# Patient Record
Sex: Male | Born: 1965 | Hispanic: No | Marital: Married | Smoking: Current every day smoker
Health system: Southern US, Community
[De-identification: ages and names within clinical notes are randomized; demographics above are authoritative.]

## PROBLEM LIST (undated history)

## (undated) DIAGNOSIS — F17210 Nicotine dependence, cigarettes, uncomplicated: Secondary | ICD-10-CM

## (undated) DIAGNOSIS — T8484XA Pain due to internal orthopedic prosthetic devices, implants and grafts, initial encounter: Secondary | ICD-10-CM

## (undated) DIAGNOSIS — Z8619 Personal history of other infectious and parasitic diseases: Secondary | ICD-10-CM

## (undated) DIAGNOSIS — I10 Essential (primary) hypertension: Secondary | ICD-10-CM

## (undated) DIAGNOSIS — M25539 Pain in unspecified wrist: Secondary | ICD-10-CM

## (undated) DIAGNOSIS — E119 Type 2 diabetes mellitus without complications: Secondary | ICD-10-CM

## (undated) DIAGNOSIS — E785 Hyperlipidemia, unspecified: Secondary | ICD-10-CM

## (undated) DIAGNOSIS — Z87898 Personal history of other specified conditions: Secondary | ICD-10-CM

## (undated) HISTORY — DX: Nicotine dependence, cigarettes, uncomplicated: F17.210

## (undated) HISTORY — DX: Hyperlipidemia, unspecified: E78.5

## (undated) HISTORY — DX: Personal history of other specified conditions: Z87.898

## (undated) HISTORY — DX: Type 2 diabetes mellitus without complications: E11.9

## (undated) HISTORY — DX: Pain in unspecified wrist: M25.539

## (undated) HISTORY — DX: Personal history of other infectious and parasitic diseases: Z86.19

---
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
## 2005-04-16 ENCOUNTER — Ambulatory Visit: Payer: Self-pay | Admitting: Pulmonary Disease

## 2005-11-19 ENCOUNTER — Ambulatory Visit: Payer: Self-pay | Admitting: Pulmonary Disease

## 2006-05-20 ENCOUNTER — Ambulatory Visit: Payer: Self-pay | Admitting: Pulmonary Disease

## 2007-05-18 ENCOUNTER — Encounter: Payer: Self-pay | Admitting: Pulmonary Disease

## 2007-06-20 ENCOUNTER — Telehealth (INDEPENDENT_AMBULATORY_CARE_PROVIDER_SITE_OTHER): Payer: Self-pay | Admitting: *Deleted

## 2007-07-13 DIAGNOSIS — E785 Hyperlipidemia, unspecified: Secondary | ICD-10-CM | POA: Insufficient documentation

## 2007-07-14 ENCOUNTER — Ambulatory Visit: Payer: Self-pay | Admitting: Pulmonary Disease

## 2007-07-14 DIAGNOSIS — F17200 Nicotine dependence, unspecified, uncomplicated: Secondary | ICD-10-CM | POA: Insufficient documentation

## 2007-07-14 DIAGNOSIS — J309 Allergic rhinitis, unspecified: Secondary | ICD-10-CM | POA: Insufficient documentation

## 2007-07-14 DIAGNOSIS — M25539 Pain in unspecified wrist: Secondary | ICD-10-CM | POA: Insufficient documentation

## 2007-07-14 DIAGNOSIS — R51 Headache: Secondary | ICD-10-CM | POA: Insufficient documentation

## 2007-07-14 DIAGNOSIS — R519 Headache, unspecified: Secondary | ICD-10-CM | POA: Insufficient documentation

## 2007-07-14 DIAGNOSIS — A630 Anogenital (venereal) warts: Secondary | ICD-10-CM

## 2007-07-14 LAB — CONVERTED CEMR LAB
ALT: 55 units/L — ABNORMAL HIGH (ref 0–53)
AST: 28 units/L (ref 0–37)
Bilirubin, Direct: 0.3 mg/dL (ref 0.0–0.3)
CO2: 26 meq/L (ref 19–32)
Calcium: 9.7 mg/dL (ref 8.4–10.5)
Chloride: 105 meq/L (ref 96–112)
Cholesterol: 209 mg/dL (ref 0–200)
Direct LDL: 131.2 mg/dL
GFR calc non Af Amer: 88 mL/min
Glucose, Bld: 98 mg/dL (ref 70–99)
Sodium: 141 meq/L (ref 135–145)
Total Bilirubin: 1.7 mg/dL — ABNORMAL HIGH (ref 0.3–1.2)
Total CHOL/HDL Ratio: 4.6
Total Protein: 7 g/dL (ref 6.0–8.3)

## 2008-05-07 ENCOUNTER — Telehealth (INDEPENDENT_AMBULATORY_CARE_PROVIDER_SITE_OTHER): Payer: Self-pay | Admitting: *Deleted

## 2008-05-08 ENCOUNTER — Encounter: Payer: Self-pay | Admitting: Adult Health

## 2008-05-08 ENCOUNTER — Ambulatory Visit: Payer: Self-pay | Admitting: Pulmonary Disease

## 2008-05-17 ENCOUNTER — Telehealth (INDEPENDENT_AMBULATORY_CARE_PROVIDER_SITE_OTHER): Payer: Self-pay | Admitting: *Deleted

## 2008-06-10 ENCOUNTER — Telehealth (INDEPENDENT_AMBULATORY_CARE_PROVIDER_SITE_OTHER): Payer: Self-pay | Admitting: *Deleted

## 2008-06-13 ENCOUNTER — Ambulatory Visit: Payer: Self-pay | Admitting: Pulmonary Disease

## 2008-06-13 ENCOUNTER — Telehealth: Payer: Self-pay | Admitting: Internal Medicine

## 2008-06-18 ENCOUNTER — Ambulatory Visit: Payer: Self-pay | Admitting: Pulmonary Disease

## 2008-06-18 DIAGNOSIS — E118 Type 2 diabetes mellitus with unspecified complications: Secondary | ICD-10-CM

## 2008-06-18 LAB — CONVERTED CEMR LAB
Direct LDL: 105.5 mg/dL
Total CHOL/HDL Ratio: 6
Triglycerides: 726 mg/dL (ref 0–149)

## 2008-06-27 LAB — CONVERTED CEMR LAB
ALT: 30 units/L (ref 0–53)
AST: 24 units/L (ref 0–37)
Basophils Relative: 0.5 % (ref 0.0–3.0)
Bilirubin, Direct: 0.2 mg/dL (ref 0.0–0.3)
CO2: 22 meq/L (ref 19–32)
Chloride: 93 meq/L — ABNORMAL LOW (ref 96–112)
Eosinophils Relative: 1.3 % (ref 0.0–5.0)
Glucose, Bld: 386 mg/dL — ABNORMAL HIGH (ref 70–99)
HCT: 48.4 % (ref 39.0–52.0)
Hemoglobin: 17.1 g/dL — ABNORMAL HIGH (ref 13.0–17.0)
Lymphocytes Relative: 25.7 % (ref 12.0–46.0)
Monocytes Absolute: 0.3 10*3/uL (ref 0.1–1.0)
Monocytes Relative: 4 % (ref 3.0–12.0)
Mucus, UA: NEGATIVE
Neutro Abs: 5.4 10*3/uL (ref 1.4–7.7)
Nitrite: NEGATIVE
RBC: 5.04 M/uL (ref 4.22–5.81)
RDW: 11.6 % (ref 11.5–14.6)
Sed Rate: 5 mm/hr (ref 0–16)
Sodium: 132 meq/L — ABNORMAL LOW (ref 135–145)
Squamous Epithelial / LPF: NEGATIVE /lpf
TSH: 1.53 microintl units/mL (ref 0.35–5.50)
Total Bilirubin: 2.3 mg/dL — ABNORMAL HIGH (ref 0.3–1.2)
Total Protein, Urine: NEGATIVE mg/dL
Total Protein: 6.6 g/dL (ref 6.0–8.3)
Urine Glucose: >=1000 mg/dL — CR
WBC: 7.8 10*3/uL (ref 4.5–10.5)
pH: 5.5 (ref 5.0–8.0)

## 2008-07-16 ENCOUNTER — Ambulatory Visit: Payer: Self-pay | Admitting: Pulmonary Disease

## 2008-07-21 LAB — CONVERTED CEMR LAB
BUN: 8 mg/dL (ref 6–23)
Calcium: 9.7 mg/dL (ref 8.4–10.5)
Cholesterol: 192 mg/dL (ref 0–200)
Creatinine, Ser: 0.8 mg/dL (ref 0.4–1.5)
GFR calc Af Amer: 138 mL/min
Glucose, Bld: 107 mg/dL — ABNORMAL HIGH (ref 70–99)
HDL: 47.2 mg/dL (ref >39.0)
Triglycerides: 204 mg/dL (ref 0–149)

## 2008-07-23 ENCOUNTER — Telehealth (INDEPENDENT_AMBULATORY_CARE_PROVIDER_SITE_OTHER): Payer: Self-pay | Admitting: *Deleted

## 2008-10-15 ENCOUNTER — Telehealth: Payer: Self-pay | Admitting: Pulmonary Disease

## 2008-10-18 ENCOUNTER — Ambulatory Visit: Payer: Self-pay | Admitting: Pulmonary Disease

## 2008-10-18 LAB — CONVERTED CEMR LAB
AST: 22 units/L (ref 0–37)
BUN: 20 mg/dL (ref 6–23)
Cholesterol: 192 mg/dL (ref 0–200)
GFR calc non Af Amer: 87.58 mL/min (ref >60)
Hgb A1c MFr Bld: 5.5 % (ref 4.6–6.5)
LDL Cholesterol: 128 mg/dL — ABNORMAL HIGH (ref 0–99)
Potassium: 4.6 meq/L (ref 3.5–5.1)
Sodium: 142 meq/L (ref 135–145)
Total Bilirubin: 1.6 mg/dL — ABNORMAL HIGH (ref 0.3–1.2)
Total CHOL/HDL Ratio: 4
VLDL: 16.6 mg/dL (ref 0.0–40.0)

## 2008-12-02 ENCOUNTER — Telehealth (INDEPENDENT_AMBULATORY_CARE_PROVIDER_SITE_OTHER): Payer: Self-pay | Admitting: *Deleted

## 2009-01-28 ENCOUNTER — Telehealth: Payer: Self-pay | Admitting: Pulmonary Disease

## 2009-02-07 ENCOUNTER — Ambulatory Visit: Payer: Self-pay | Admitting: Pulmonary Disease

## 2009-02-11 ENCOUNTER — Ambulatory Visit: Payer: Self-pay | Admitting: Pulmonary Disease

## 2009-02-11 LAB — CONVERTED CEMR LAB
CO2: 31 meq/L (ref 19–32)
Chloride: 109 meq/L (ref 96–112)
Creatinine, Ser: 1.1 mg/dL (ref 0.4–1.5)

## 2009-07-22 ENCOUNTER — Telehealth (INDEPENDENT_AMBULATORY_CARE_PROVIDER_SITE_OTHER): Payer: Self-pay | Admitting: *Deleted

## 2009-07-25 ENCOUNTER — Ambulatory Visit: Payer: Self-pay | Admitting: Pulmonary Disease

## 2009-08-01 ENCOUNTER — Ambulatory Visit: Payer: Self-pay | Admitting: Pulmonary Disease

## 2009-08-01 LAB — CONVERTED CEMR LAB
Basophils Relative: 0.1 % (ref 0.0–3.0)
CO2: 28 meq/L (ref 19–32)
Eosinophils Relative: 4.4 % (ref 0.0–5.0)
Glucose, Bld: 114 mg/dL — ABNORMAL HIGH (ref 70–99)
HCT: 50.5 % (ref 39.0–52.0)
HDL: 48.7 mg/dL (ref >39.00)
Hgb A1c MFr Bld: 6.3 % (ref 4.6–6.5)
LDL Cholesterol: 122 mg/dL — ABNORMAL HIGH (ref 0–99)
Lymphs Abs: 2.5 10*3/uL (ref 0.7–4.0)
MCV: 97 fL (ref 78.0–100.0)
Monocytes Absolute: 0.4 10*3/uL (ref 0.1–1.0)
Potassium: 4.6 meq/L (ref 3.5–5.1)
RBC: 5.21 M/uL (ref 4.22–5.81)
Sodium: 141 meq/L (ref 135–145)
Total Bilirubin: 1 mg/dL (ref 0.3–1.2)
Total CHOL/HDL Ratio: 4
VLDL: 15 mg/dL (ref 0.0–40.0)
WBC: 6.3 10*3/uL (ref 4.5–10.5)

## 2010-01-13 ENCOUNTER — Telehealth: Payer: Self-pay | Admitting: Pulmonary Disease

## 2010-01-23 ENCOUNTER — Ambulatory Visit: Payer: Self-pay | Admitting: Pulmonary Disease

## 2010-01-30 ENCOUNTER — Ambulatory Visit: Payer: Self-pay | Admitting: Pulmonary Disease

## 2010-01-30 LAB — CONVERTED CEMR LAB
BUN: 18 mg/dL (ref 6–23)
Bilirubin, Direct: 0.2 mg/dL (ref 0.0–0.3)
Chloride: 105 meq/L (ref 96–112)
HDL: 42.6 mg/dL (ref >39.00)
Hgb A1c MFr Bld: 6.2 % (ref 4.6–6.5)
Potassium: 5.1 meq/L (ref 3.5–5.1)
Sodium: 142 meq/L (ref 135–145)
Total Bilirubin: 1.5 mg/dL — ABNORMAL HIGH (ref 0.3–1.2)
Total Protein: 6.6 g/dL (ref 6.0–8.3)
Triglycerides: 111 mg/dL (ref 0.0–149.0)
VLDL: 22.2 mg/dL (ref 0.0–40.0)

## 2010-02-10 ENCOUNTER — Telehealth: Payer: Self-pay | Admitting: Pulmonary Disease

## 2010-04-06 ENCOUNTER — Telehealth (INDEPENDENT_AMBULATORY_CARE_PROVIDER_SITE_OTHER): Payer: Self-pay | Admitting: *Deleted

## 2010-07-16 NOTE — Progress Notes (Signed)
Summary: sick  Phone Note Call from Patient Call back at Home Phone [phone]   Caller: Spouse-Jerome Waller Call For: Jerome Waller Reason for Call: Talk to Nurse Summary of Call: pt nose congested feels head is full of gunk, cough, tight,low grade fever, feels crappy.  Please advise [facility] Initial call taken by: Eugene Gavia,  February 10, 2010 4:19 PM  Follow-up for Phone Call        called and spoke with pt's wife.  wife states pt's symptoms started on Saturday.  Pt c/o head and nasal congestion, headache, coughing up brown tinged sputum, no appetitie  and slight facial pressure.  Denies sore throat and body aches.  Pt has tried robitussin and nasal saline spray with no relief of symptoms.  Please advise.  thanks.  Aundra Millet Reynolds LPN  February 10, 2010 4:45 PM  NKDA  Additional Follow-up for Phone Call Additional follow up Details #1::        per SN---ok for pt to have augmentin 875mg   1 by mouth two times a day until gone  #20  and use the mucinex 2 by mouth two times a day with plenty of fluids.  called and spoke with pts wife and she is aware.  pt to cont to use the robitussion for cough. Randell Loop CMA  February 10, 2010 4:55 PM     New/Updated Medications: AUGMENTIN 875-125 MG TABS (AMOXICILLIN-POT CLAVULANATE) take one tablet by mouth two times a day until gone MUCINEX 600 MG XR12H-TAB (GUAIFENESIN) take 2 by mouth two times a day with plenty of fluids Prescriptions: AUGMENTIN 875-125 MG TABS (AMOXICILLIN-POT CLAVULANATE) take one tablet by mouth two times a day until gone  #20 x 0   Entered by:   Randell Loop CMA   Authorized by:   Michele Mcalpine MD   Signed by:   Randell Loop CMA on 02/10/2010   Method used:   Electronically to        [facility]* (retail)       [address] [place], [place]  [address]       Ph: [phone] or [phone]       Fax: [phone]   RxID:   [id number]

## 2010-07-16 NOTE — Progress Notes (Signed)
Summary: head cold  Phone Note Call from Patient Call back at Home Phone [phone]   Caller: wife- karen Call For: nadel Summary of Call: pt has come down with a head cold no fever [facility] Initial call taken by: Lacinda Axon,  April 06, 2010 5:25 PM  Follow-up for Phone Call        Spoke with pt's spouse.  Pt c/o nasal congestion and occ dry cough x several months- states that he got worse after the flu shot.  We called in augmetin for him on 8/30 and he states that this helped but now symptoms are coming back.  Pls advise thanks NKDA Follow-up by: Vernie Murders,  April 06, 2010 5:32 PM  Additional Follow-up for Phone Call Additional follow up Details #1::        per SN---ok for pt to have zpak #1  TAD with no refills, use the mucinex d  2 two times a day with plenty of fluids and stay on this  and must quit all smoking and 2nd hand exposure.  thanks Randell Loop CMA  April 07, 2010 9:07 AM     Additional Follow-up for Phone Call Additional follow up Details #2::    Spoke with pt's spouse and notified of the above recs per SN.  Spouse verbalized understanding and will let the pt know. Follow-up by: Vernie Murders,  April 07, 2010 9:15 AM  New/Updated Medications: ZITHROMAX Z-PAK 250 MG TABS (AZITHROMYCIN) take as directed Prescriptions: ZITHROMAX Z-PAK 250 MG TABS (AZITHROMYCIN) take as directed  #1 x 0   Entered by:   Vernie Murders   Authorized by:   Michele Mcalpine MD   Signed by:   Vernie Murders on 04/07/2010   Method used:   Electronically to        [facility]* (retail)       [address] [place], [place]  [address]       Ph: [phone] or [phone]       Fax: [phone]   RxID:   [id number]

## 2010-07-16 NOTE — Progress Notes (Signed)
Summary: set up labs  Phone Note Call from Patient Call back at Home Phone [phone]   Caller: Mom Call For: nadel Summary of Call: pt's mom karen requests to have pt's labs set up for nex fri 8/12 (when he's off).  Initial call taken by: Tivis Ringer, CNA,  January 13, 2010 9:20 AM  Follow-up for Phone Call        pt has appt 8/19, wants to come for labs next friday. pls have sn mark labs and put in system for pt let pt know once labs are in   Tammy Landrum CMA  January 13, 2010 10:17 AM   Additional Follow-up for Phone Call Additional follow up Details #1::        per SN---ok for pt to come in on 8-12 for fasting labs prior to his appt on 8-19.  called and spoke with pts wife and she is aware of this. Randell Loop CMA  January 13, 2010 2:47 PM

## 2010-07-16 NOTE — Assessment & Plan Note (Signed)
Summary: 10m reck/klw   CC:  6 month ROV & review of mult medical problems....  History of Present Illness: 45 y/o WM here for a follow up visit... he was diagnosed w/ DM in [name] & doing well on diet/ exercise/ meds since then...   ~   [name]:  feeling bad ever since Thanksgiving when he "got the crud"-  c/o fatigue, sore throat, sl cough w/ beige phlegm, abd discomfort w/o n/v or change in bowel habits... he denies f/c/s but had decreased appetite, increased thirst, dry mouth and polyuria... he has noted a 25# weight loss... his mother-in-law is diabetic and checked his BS which was HHH- he put himself on a diet and rechecked 294... he waited til after the holidays to call us and let us know... BS= 386, A1c= 14.6.Marland Kitchen. started on Metformin 500Bid + diet etc...  ~  Feb10:  he states much improved over the last month- he called [place] after 2 weeks and Metformin incr to 1000mg Bid & Glimepiride 4mg /d added... FBS down to 127-159 range and 2H pc = 99-192... weight stable at 183#... feeling better overall...  ~  May10:  doing well for the last several months... weight stabilize in the 180-185 range, and BS's range 80 to 140 but he notes ?hypoglycemic at 9-10AM at work (can't check BS but symptoms compatible & feels better after eating... labs today w/ BS= 109 & A1c= 5.5, so we will decr the Gliep4 to 1/2 Qam...  ~  Aug10:  continues to do well w/ diet/ exercise/ meds- BS at home 90-120 w/ 100 ave and labs here 8/27 showed FBS= 100, A1c= 5.4.Marland KitchenMarland Kitchen we discussed stopping the Glimepiride and continuing the Metformin 1000mg  Bid...   ~  August 01, 2009:  he has stayed on diet + exercise and weaned off the Metformin over the last 23mo (stopped 10/10)- now on diet alone... BS remains 100-120 & feeling well>> labs show FBS= 114, A1c=6.3 & he is encouraged to continue diet + exercise... he remains on Simva80 & recent FLP fair w/ LDL 122 (we discussed poss change to generic Lipitor later this yr)... he still smokes  ~1/4 ppd &  encouraged to quit...   ~  January 30, 2010:  he's had a good 23mo w/o new complaints or concerns... BS at home all 100-140 range, and his A1c is 6.2 on diet alone... feeling well w/o CP, palpit/ SOB, etc... he needs to quit all smoking & incr exercise program, add ASA 81mg /d... we decided to switch his Simva80 to Lipitor40 at this time...   Current Problems:   ALLERGIC RHINITIS (ICD-477.9) - mild symptoms... controlled w/ OTC Claritin or Zyrtek...  CIGARETTE SMOKER (ICD-305.1) - still smokes 1/4 ppd despite all efforts to get him to quit... he tried and failed Chantix in the past...   ~  1/10:  he tells me that he quit smoking 2 days ago, did it on his own!!!  ~  5/10:  he tells me now that he's smoking again, < 1/2 ppd... encouraged to quit!!!  ~  8/10:  back to 1/2 ppd and he feels this may be the best he can do.  ~  2/11:  down to 1/4 ppd now & encouraged to quit!  ~  8/11:  no change, doesn't want Chantix etc...  HYPERLIPIDEMIA (ICD-272.4) - hx Chol >300 in the past... doing fair on diet + SIMVASTATIN 80mg > change to LIP40 now...  ~  FLP 12/07 on Lip40 showed TChol 176, TG 101, HDL 44, LDL  111  ~  FLP 1/09 on Lip40 showed TChol 209, TG 148, HDL 46, LDL 131... rec- change to Simva80.  ~  FLP 1/10 on Simva80 showed TChol 319, TG 726, HDL 53, LDL 106... rec- new Rx for DM.  ~  FLP 5/10 on Simva80 showed TChol 192, TG 83, HDL 48, LDL 128... same med, better diet.  ~  FLP 2/11 on Simva80 showed TChol 186, TG 75, HDL 49, LDL 122  ~  FLP 8/11 on Simva80 showed TChol 208, TG 111, HDL 43, LDL 144... rec ch to Lipitor40.  DIABETES MELLITUS (ICD-250.00) - new Dx [name]> started on Metformin & Glimepiride;  he did very well w/ diet + exercise & weaned off all meds 10/10- now on diet alone...  ~  labs 1/10 showed BS= 386, HgA1c= 14.6.Marland Kitchen. rec> started Metformin1000Bid + diet/ exerc.  ~  labs 2/10 showed BS= 107, HgA1c= 10.7.Marland Kitchen. rec> add Glimep4mg /d.  ~  labs 5/10 showed BS= 107, HgA1c= 5.5.Marland Kitchen. rec> decr  Glimep4 to 1/2 tabQam.  ~  labs 8/10 showed BS= 100, A1c= 5.4.Marland Kitchen. rec> stop the Glimep, & taper Metform.  ~  labs 2/11 showed BS= 114, A1c= 6.3.Marland KitchenMarland Kitchen continue diet + exercise.  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  WRIST PAIN (ICD-719.43) - eval by DrKuzma w/ MRI showing scapholunate ligament disruption... Rx splint, Mobic, consideration of surgery.  Hx of HEADACHE (ICD-784.0)   Preventive Screening-Counseling & Management  Alcohol-Tobacco     Smoking Status: quit     Packs/Day: <1/2     Year Quit: 06/2008  Allergies (verified): No Known Drug Allergies  Comments:  Nurse/Medical Assistant: The patient's medications and allergies were reviewed with the patient and were updated in the Medication and Allergy Lists.  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 Father alive at age 24 w/ high cholesterol Mother alive at age 44 in good health 2 Siblings: 1 Brother w/ high cholesterol 1 Sister w/ high cholesterol, & incr BS on diet alone. ** there is a hx of DM in paternal grandfather **  Social History: Reviewed history from 06/18/2008 and no changes required. Married - wife Clydie Braun x 14 yrs no children smoker - states he quit 06/16/08 social Etoh Smoking Status:  quit  Review of Systems      See HPI  The patient denies anorexia, fever, weight loss, weight gain, vision loss, decreased hearing, hoarseness, chest pain, syncope, dyspnea on exertion, peripheral edema, prolonged cough, headaches, hemoptysis,  abdominal pain, melena, hematochezia, severe indigestion/heartburn, hematuria, incontinence, muscle weakness, suspicious skin lesions, transient blindness, difficulty walking, depression, unusual weight change, abnormal bleeding, enlarged lymph nodes, and angioedema.    Vital Signs:  Patient profile:   45 year old male Height:      72 inches Weight:      187 pounds BMI:     25.45 O2 Sat:      98 % on Room air Temp:     98.4 degrees F oral Pulse rate:   78 / minute BP sitting:   120 / 80  (left arm) Cuff size:   regular  Vitals  Entered By: Randell Loop CMA (January 30, 2010 9:59 AM)  O2 Sat at Rest %:  98 O2 Flow:  Room air CC: 6 month ROV & review of mult medical problems... Is Patient Diabetic? Yes Pain Assessment Patient in pain? no      Comments no changes in meds today   Physical Exam  Additional Exam:  WD, WN, 45 y/o WM in NAD... GENERAL:  Alert & oriented; pleasant & cooperative... HEENT:  [place]/AT, EOM-wnl, PERRLA, EACs-clear, TMs-wnl, NOSE-clear, THROAT-clear & wnl. NECK:  Supple w/ full ROM; no JVD; normal carotid impulses w/o bruits; no thyromegaly or nodules palpated; no lymphadenopathy. CHEST:  Clear to P & A; without wheezes/ rales/ or rhonchi. HEART:  Regular Rhythm; without murmurs/ rubs/ or gallops. ABDOMEN:  Soft & nontender; normal bowel sounds; no organomegaly or masses detected. EXT: without deformities, mild arthritic change in R wrist; no varicose veins/ venous insuffic/ or edema. NEURO:  CN's intact, no focal neuro deficits... SKIN:  WNL, no lesions seen...    MISC. Report  Procedure date:  01/23/2010  Findings:      Lipid Panel (LIPID)   Cholesterol          [H]  208 mg/dL                   0-454   Triglycerides             111.0 mg/dL                 0.9-811.9   HDL                       14.78 mg/dL                 >29.56 Cholesterol LDL - Direct                             144.1 mg/dL  Hepatic/Liver Function Panel (HEPATIC)   Total  Bilirubin      [H]  1.5 mg/dL                   2.1-3.0   Direct Bilirubin          0.2 mg/dL                   8.6-5.7   Alkaline Phosphatase      94 U/L                      39-117   AST                       21 U/L                      0-37   ALT                       29 U/L                      0-53   Total Protein             6.6 g/dL                    8.4-6.9   Albumin  4.2 g/dL                    1.6-1.0  BMP (METABOL)   Sodium                    142 mEq/L                   135-145   Potassium                 5.1 mEq/L                   3.5-5.1   Chloride                  105 mEq/L                   96-112   Carbon Dioxide            28 mEq/L                    19-32   Glucose              [H]  100 mg/dL                   96-04   BUN                       18 mg/dL                    5-40   Creatinine                0.9 mg/dL                   9.8-1.1   Calcium                   9.6 mg/dL                   9.1-47.8   GFR                       98.29 mL/min                >60  Hemoglobin A1C (A1C)   Hemoglobin A1C            6.2 %     Impression & Recommendations:  Problem # 1:  DIABETES MELLITUS (ICD-250.00) He continues to do extremely well on diet + exercise... A1c= 6.2, keep up the good work. His updated medication list for this problem includes:    Aspirin 81 Mg Tabs (Aspirin) .Marland Kitchen... Take 1 tab by mouth once daily...  Problem # 2:  HYPERLIPIDEMIA (ICD-272.4) FLP not at goal despite Simva80 & in light of the new guidlines we will rec switch to LIPITOR 40mg /d... His updated medication list for this problem includes:    Lipitor 40 Mg Tabs (Atorvastatin calcium) .Marland Kitchen... Take 1 tab by mouth once daily...  Problem # 3:  CIGARETTE SMOKER (ICD-305.1) Must quit all smoking!!! Discussed smoking cessation options and strategies but he declines...  Problem # 4:  OTHER MEDICAL PROBLEMS AS NOTED>>>  Complete Medication List: 1)  Aspirin 81 Mg Tabs (Aspirin) ....  Take 1 tab by mouth once daily.Marland KitchenMarland Kitchen 2)  Lipitor 40 Mg Tabs (Atorvastatin calcium) .... Take 1 tab by mouth once daily.Marland KitchenMarland Kitchen 3)  IT consultant Strp (Glucose blood) .Marland KitchenMarland KitchenMarland Kitchen  Use as directed once a day  Patient Instructions: 1)  Today we updated your med list- see below.... 2)  We decided to change your Simvastatin to LIPITOR 40mg /d... Be sure to continue your diet - no sugars/ sweets, low carb, & stay away from cholesterol & fat... 3)  Call for any problems.Marland KitchenMarland Kitchen 4)  Please schedule a follow-up appointment in 6 months. Prescriptions: LIPITOR 40 MG TABS (ATORVASTATIN CALCIUM) take 1 tab by mouth once daily...  #30 x prn   Entered and Authorized by:   Michele Mcalpine MD   Signed by:   Michele Mcalpine MD on 01/30/2010   Method used:   Print then Give to Patient   RxID:   [id number]    Immunization History:  Influenza Immunization History:    Influenza:  historical (08/18/2009)

## 2010-07-16 NOTE — Progress Notes (Signed)
Summary: order   [id number]  Phone Note Call from Patient   Caller: Patient Call For: nadel Summary of Call: need order put in to get cholesterol checked  Initial call taken by: Rickard Patience,  July 22, 2009 9:30 AM  Follow-up for Phone Call        LM with family member for pt to call me back.  Aundra Millet Reynolds LPN  July 22, 2009 9:37 AM    pt wants to come in 2-11 for fasting labs---is this ok??  please advise. Randell Loop [facility]  July 22, 2009 9:53 AM   Additional Follow-up for Phone Call Additional follow up Details #1::        ok for pt to come in for labs and pts wife is aware that labs are in computer for pt----per SN---lip-272.0/bmp-401.9/hepat-790.5/cbcd-285.9/tsh-244.9 and a1c-250.00--- Randell Loop CMA  July 22, 2009 2:42 PM

## 2010-07-16 NOTE — Assessment & Plan Note (Signed)
Summary: 6  month follow up/lw   CC:  6 month ROV & review....  History of Present Illness: 45 y/o WM here for a follow up visit... he was diagnosed w/ DM in [name] & doing well on diet/ exercise/ meds since then...   ~   [name]:  feeling bad ever since Thanksgiving when he "got the crud"-  c/o fatigue, sore throat, sl cough w/ beige phlegm, abd discomfort w/o n/v or change in bowel habits... he denies f/c/s but had decreased appetite, increased thirst, dry mouth and polyuria... he has noted a 25# weight loss... his mother-in-law is diabetic and checked his BS which was HHH- he put himself on a diet and rechecked 294... he waited til after the holidays to call us and let us know... BS= 386, A1c= 14.6.Marland Kitchen. started on Metformin 500Bid + diet etc...  ~  Feb10:  he states much improved over the last month- he called [place] after 2 weeks and Metformin incr to 1000mg Bid & GLimepiride 4mg /d added... FBS down to 127-159 range and 2H pc = 99-192... weight stable at 183#... feeling better overall...  ~  May10:  doing well for the last several months... weight stabilize in the 180-185 range, and BS's range 80 to 140 but he notes ?hypoglycemic at 9-10AM at work (can't check BS but symptoms compatible & feels better after eating... labs today w/ BS= 109 & A1c= 5.5, so we will decr the Gliep4 to 1/2 Qam...  ~  Aug10:  continues to do well w/ diet/ exercise/ meds- BS at home 90-120 w/ 100 ave and labs here 8/27 showed FBS= 100, A1c= 5.4.Marland KitchenMarland Kitchen we discussed stopping the Glimepiride and continuing the Metformin 1000mg  Bid...   ~  August 01, 2009:  he has stayed on diet + exercise and weaned off the Metformin over the last 15mo (stopped 10/10)- now on diet alone... BS remains 100-120 & feeling well>> labs show FBS= 114, A1c=6.3 & he is encouraged to continue diet + exercise... he remains on Simva80 & recent FLP fair w/ LDL 122 (we discussed poss change to generic Lipitor later this yr)... he still smokes  ~1/4 ppd & encouraged to  quit...   Current Problems:   ALLERGIC RHINITIS (ICD-477.9) - mild symptoms... controlled w/ OTC Claritin or Zyrtek...  CIGARETTE SMOKER (ICD-305.1) - still smokes 1/4 ppd despite all efforts to get him to quit... he tried and failed Chantix in the past...   ~  1/10:  he tells me that he quit smoking 2 days ago, did it on his own!!!  ~  5/10:  he tells me now that he's smoking again, < 1/2 ppd... encouraged to quit!!!  ~  8/10:  back to 1/2 ppd and he feels this may be the best he can do.  ~  2/11:  down to 1/4 ppd now & encouraged to quit!  HYPERLIPIDEMIA (ICD-272.4) - hx Chol >300 in the past... doing fairl on diet + SIMVASTATIN 80mg ...  ~  FLP 12/07 on Lip40 showed TChol 176, TG 101, HDL 44, LDL 111  ~  FLP 1/09 on Lip40 showed TChol 209, TG 148, HDL 46, LDL 131... rec- change to Simva80.  ~  FLP 1/10 on Simva80 showed TChol 319, TG 726, HDL 53, LDL 106... rec- new Rx for DM.  ~  FLP 5/10 on Simva80 showed TChol 192, TG 83, HDL 48, LDL 128... same med, better diet.  ~  FLP 2/11 on Simva80 showed TChol 186, TG 75, HDL 49, LDL 122  DIABETES MELLITUS (ICD-250.00) - new Dx [name]> started on Metformin & Glimepiride;  he did very well w/ diet + exercise & weaned off all meds 10/10- now on diet alone...  ~  labs 1/10 showed BS= 386, HgA1c= 14.6.Marland Kitchen. rec> started Metformin1000Bid + diet/ exerc.  ~  labs 2/10 showed BS= 107, HgA1c= 10.7.Marland Kitchen. rec> add Glimep4mg /d.  ~  labs 5/10 showed BS= 107, HgA1c= 5.5.Marland Kitchen. rec> decr Glimep4 to 1/2 tabQam.  ~  labs 8/10 showed BS= 100, A1c= 5.4.Marland Kitchen. rec> stop the Glimep, & taper Metform.  ~  labs 2/11 showed BS= 114, A1c= 6.3.Marland KitchenMarland Kitchen continue diet + exercise.  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  WRIST PAIN (ICD-719.43) - eval by DrKuzma w/ MRI showing scapholunate ligament disruption... Rx splint, Mobic, consideration of surgery.  Hx of HEADACHE (ICD-784.0)   Allergies  (verified): No Known Drug Allergies  Comments:  Nurse/Medical Assistant: The patient's medications and allergies were reviewed with the patient and were updated in the Medication and Allergy Lists.  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 Father alive at age 43 w/ high cholesterol Mother alive at age 53 in good health 2 Siblings: 1 Brother w/ high cholesterol 1 Sister w/ high cholesterol, & incr BS on diet alone. ** there is a hx of DM in paternal grandfather **  Social History: Reviewed history from 06/18/2008 and no changes required. Married - wife Clydie Braun x 14 yrs no children smoker - states he quit 06/16/08 social Etoh  Review of Systems      See HPI  The patient denies anorexia, fever, weight loss, weight gain, vision loss, decreased hearing, hoarseness, chest pain, syncope, dyspnea on exertion, peripheral edema, prolonged cough, headaches, hemoptysis, abdominal pain, melena, hematochezia, severe indigestion/heartburn, hematuria, incontinence, muscle weakness, suspicious skin lesions, transient blindness, difficulty walking, depression, unusual weight change, abnormal bleeding, enlarged lymph nodes, and angioedema.    Vital Signs:  Patient profile:   45 year old male Height:      72 inches Weight:      185.25 pounds BMI:     25.22 O2 Sat:      97 % on Room air Temp:     97.9 degrees F oral Pulse rate:   77 / minute BP sitting:   116 / 82  (left arm) Cuff size:   regular  Vitals Entered By: Randell Loop CMA (August 01, 2009 11:51 AM)  O2 Sat at Rest %:  97 O2 Flow:  Room air CC: 6 month ROV & review... Is Patient  Diabetic? Yes Pain Assessment Patient in pain? no      Comments meds updated today   Physical Exam  Additional Exam:  WD, WN, 45 y/o WM in NAD... GENERAL:  Alert & oriented; pleasant & cooperative... HEENT:  /AT, EOM-wnl, PERRLA, EACs-clear, TMs-wnl, NOSE-clear, THROAT-clear & wnl. NECK:  Supple w/ full ROM; no JVD; normal carotid impulses w/o bruits; no thyromegaly or nodules palpated; no lymphadenopathy. CHEST:  Clear to P & A; without wheezes/ rales/ or rhonchi. HEART:  Regular Rhythm; without murmurs/ rubs/ or gallops. ABDOMEN:  Soft & nontender; normal bowel sounds; no organomegaly or masses detected. EXT: without deformities,  mild arthritic change in R wrist; no varicose veins/ venous insuffic/ or edema. NEURO:  CN's intact, no focal neuro deficits... SKIN:  WNL, no lesions seen...    MISC. Report  Procedure date:  07/25/2009  Findings:      BMP (METABOL)   Sodium                    141 mEq/L                   135-145   Potassium                 4.6 mEq/L                   3.5-5.1   Chloride                  108 mEq/L                   96-112   Carbon Dioxide            28 mEq/L                    19-32   Glucose              [H]  114 mg/dL                   11-91   BUN                       13 mg/dL                    4-78   Creatinine                0.9 mg/dL                   2.9-5.6   Calcium                   9.5 mg/dL                   2.1-30.8   GFR                       98.53 mL/min                >60  Lipid Panel (LIPID)   Cholesterol               186 mg/dL                   6-578   Triglycerides             75.0 mg/dL                  4.6-962.9   HDL                       52.84 mg/dL                 >13.24   LDL Cholesterol      [H]  401 mg/dL                   0-27  Hepatic/Liver Function Panel (HEPATIC)   Total Bilirubin           1.0 mg/dL  0.3-1.2   Direct Bilirubin          0.3 mg/dL                   1.6-1.0   Alkaline  Phosphatase      92 U/L                      39-117   AST                       21 U/L                      0-37   ALT                       27 U/L                      0-53   Total Protein             6.6 g/dL                    9.6-0.4   Albumin                   4.2 g/dL                    5.4-0.9  Comments:      CBC Platelet w/Diff (CBCD)   White Cell Count          6.3 K/uL                    4.5-10.5   Red Cell Count            5.21 Mil/uL                 4.22-5.81   Hemoglobin           [H]  17.5 g/dL                   81.1-91.4   Hematocrit                50.5 %                      39.0-52.0   MCV                       97.0 fl                     78.0-100.0   Platelet Count            204.0 K/uL                  150.0-400.0   Neutrophil %              48.3 %                      43.0-77.0   Lymphocyte %              40.3 %                      12.0-46.0   Monocyte %                6.9 %  3.0-12.0   Eosinophils%              4.4 %                       0.0-5.0   Basophils %               0.1 %                       0.0-3.0  TSH (TSH)   FastTSH                   0.98 uIU/mL                 0.35-5.50  Hemoglobin A1C (A1C)   Hemoglobin A1C            6.3 %                       4.6-6.5   Impression & Recommendations:  Problem # 1:  CIGARETTE SMOKER (ICD-305.1) Must quit smoking completely... he isn't motivated to do so... he's down to 4-5 cig/d which is better than he ever thought... encouraged to quit 1 cig at a time...  Problem # 2:  HYPERLIPIDEMIA (ICD-272.4) Not yet at goal... offered change to Lipitor80 but he doesn't want to switch as yet... His updated medication list for this problem includes:    Simvastatin 80 Mg Tabs (Simvastatin) .Marland Kitchen... 1 tab by mouth at bedtime  Problem # 3:  DIABETES MELLITUS (ICD-250.00) He has successfully stopped his oral DM meds and maintaining control on diet + exercise... FBS=114, A1c=6.3.Marland KitchenMarland Kitchen Encouraged to keep up the  good work & monitor BS...  The following medications were removed from the medication list:    Metformin Hcl 1000 Mg Tabs (Metformin hcl) .Marland Kitchen... Take 1 tab by mouth two times a day...  Problem # 4:  OTHER MEDICAL PROBLEMS AS NOTED>>> Stable-  no change.  Complete Medication List: 1)  Simvastatin 80 Mg Tabs (Simvastatin) .Marland Kitchen.. 1 tab by mouth at bedtime 2)  Bayer Contour Test Strp (Glucose blood) .... Use as directed once a day  Patient Instructions: 1)  Today we updated your med list- see below.... 2)  We refilled your Simvastatin for 2011... 3)  Keep up the great work w diet & exercise... your blood sugar & A1c test look great without meds at this point!!! 4)  Call for any questions.Marland KitchenMarland Kitchen 5)  Let's plan another recheck in 6 months w/ repeat fasting blood work. Prescriptions: SIMVASTATIN 80 MG  TABS (SIMVASTATIN) 1 tab by mouth at bedtime  #90 x prn   Entered and Authorized by:   Michele Mcalpine MD   Signed by:   Michele Mcalpine MD on 08/01/2009   Method used:   Print then Give to Patient   RxID:   [id number]

## 2010-07-27 ENCOUNTER — Telehealth (INDEPENDENT_AMBULATORY_CARE_PROVIDER_SITE_OTHER): Payer: Self-pay | Admitting: *Deleted

## 2010-07-31 ENCOUNTER — Encounter (INDEPENDENT_AMBULATORY_CARE_PROVIDER_SITE_OTHER): Payer: Self-pay | Admitting: *Deleted

## 2010-07-31 ENCOUNTER — Ambulatory Visit (INDEPENDENT_AMBULATORY_CARE_PROVIDER_SITE_OTHER): Payer: BC Managed Care – PPO | Admitting: Pulmonary Disease

## 2010-07-31 ENCOUNTER — Other Ambulatory Visit: Payer: Self-pay

## 2010-07-31 ENCOUNTER — Other Ambulatory Visit: Payer: Self-pay | Admitting: Pulmonary Disease

## 2010-07-31 ENCOUNTER — Encounter: Payer: Self-pay | Admitting: Pulmonary Disease

## 2010-07-31 DIAGNOSIS — E785 Hyperlipidemia, unspecified: Secondary | ICD-10-CM

## 2010-07-31 DIAGNOSIS — E119 Type 2 diabetes mellitus without complications: Secondary | ICD-10-CM

## 2010-07-31 DIAGNOSIS — R748 Abnormal levels of other serum enzymes: Secondary | ICD-10-CM

## 2010-07-31 DIAGNOSIS — J309 Allergic rhinitis, unspecified: Secondary | ICD-10-CM

## 2010-07-31 DIAGNOSIS — R0989 Other specified symptoms and signs involving the circulatory and respiratory systems: Secondary | ICD-10-CM

## 2010-07-31 DIAGNOSIS — F17200 Nicotine dependence, unspecified, uncomplicated: Secondary | ICD-10-CM

## 2010-07-31 LAB — CBC WITH DIFFERENTIAL/PLATELET
Basophils Relative: 0.6 % (ref 0.0–3.0)
Eosinophils Absolute: 0.3 10*3/uL (ref 0.0–0.7)
Eosinophils Relative: 5.1 % — ABNORMAL HIGH (ref 0.0–5.0)
Hemoglobin: 18 g/dL — ABNORMAL HIGH (ref 13.0–17.0)
Lymphocytes Relative: 35.8 % (ref 12.0–46.0)
MCHC: 35.7 g/dL (ref 30.0–36.0)
Monocytes Relative: 5.6 % (ref 3.0–12.0)
Neutro Abs: 3.6 10*3/uL (ref 1.4–7.7)
Neutrophils Relative %: 52.9 % (ref 43.0–77.0)
RBC: 5.18 Mil/uL (ref 4.22–5.81)
WBC: 6.9 10*3/uL (ref 4.5–10.5)

## 2010-07-31 LAB — BASIC METABOLIC PANEL
BUN: 17 mg/dL (ref 6–23)
CO2: 28 mEq/L (ref 19–32)
Chloride: 104 mEq/L (ref 96–112)
Creatinine, Ser: 0.9 mg/dL (ref 0.4–1.5)
Glucose, Bld: 168 mg/dL — ABNORMAL HIGH (ref 70–99)
Potassium: 4.4 mEq/L (ref 3.5–5.1)

## 2010-07-31 LAB — LIPID PANEL
Cholesterol: 204 mg/dL — ABNORMAL HIGH (ref 0–200)
Total CHOL/HDL Ratio: 4
Triglycerides: 120 mg/dL (ref 0.0–149.0)

## 2010-07-31 LAB — HEPATIC FUNCTION PANEL
ALT: 28 U/L (ref 0–53)
AST: 31 U/L (ref 0–37)
Albumin: 4 g/dL (ref 3.5–5.2)
Total Bilirubin: 1.1 mg/dL (ref 0.3–1.2)
Total Protein: 6.4 g/dL (ref 6.0–8.3)

## 2010-08-05 ENCOUNTER — Telehealth: Payer: Self-pay | Admitting: Pulmonary Disease

## 2010-08-05 NOTE — Progress Notes (Signed)
Summary: labs friday morning<<<ok  Phone Note Call from Patient   Caller: spouse/karen Call For: nadel Summary of Call: Patients wife called patient has an appointment on Friday and wants to have his labs drawn Friday morning this is for hia 6 month check up. They can reached at [phone] Initial call taken by: Vedia Coffer,  July 27, 2010 9:35 AM  Follow-up for Phone Call        Pt has 6 month follow-up set on  07-31-10 and wants to come in for labs before. Please advise what labs to oder. Thanks. Carron Curie CMA  July 27, 2010 11:37 AM   Additional Follow-up for Phone Call Additional follow up Details #1::        cbc, bmet, lfts, lipid panel, A1C  needs to be fasting  Additional Follow-up by: Rubye Oaks NP,  July 27, 2010 3:59 PM    Additional Follow-up for Phone Call Additional follow up Details #2::    Pt will come for fasting labs on Fri., 07/31/2010. Left msg w/ family member.Michel Bickers [facility]  July 27, 2010 4:07 PM

## 2010-08-05 NOTE — Assessment & Plan Note (Signed)
Summary: 6 months/apc   CC:  6 month ROV & review of mult medical problems....  History of Present Illness: 45 y/o WM here for a follow up visit... he has multiple medical problems as noted below...     ~   [name]:  feeling bad ever since Thanksgiving when he "got the crud"-  c/o fatigue, sore throat, sl cough w/ beige phlegm, abd discomfort, no n/v/d... he denies f/c/s but had decreased appetite, increased thirst, dry mouth and polyuria... he has noted a 25# weight loss... his mother-in-law is diabetic and checked his BS which was HHH- he put himself on a diet and rechecked 294... he waited til after the holidays to call us... BS= 386, A1c= 14.6.Marland Kitchen. started on Metformin 500Bid + diet etc...  ~  Feb10:  he states much improved over the last month- he called [place] after 2 weeks and Metformin incr to 1000mg Bid & Glimepiride 4mg /d added... FBS down to 127-159 range and 2H pc = 99-192... weight stable at 183#... feeling better overall...  ~  May10:  doing well for the last several months... weight stabilize in the 180-185 range, and BS's range 80 to 140 but he notes ?hypoglycemic at 9-10AM at work (can't check BS but symptoms compatible & feels better after eating... labs today w/ BS= 109 & A1c= 5.5, so we will decr the Gliep4 to 1/2 Qam...  ~  Aug10:  continues to do well w/ diet/ exercise/ meds- BS at home 90-120 w/ 100 ave and labs here 8/27 showed FBS= 100, A1c= 5.4.Marland KitchenMarland Kitchen we discussed stopping the Glimepiride and continuing the Metformin 1000mg  Bid...   ~  ZOX09:  he has stayed on diet + exercise and weaned off the Metformin over the last 49mo (stopped 10/10)- now on diet alone... BS remains 100-120 & feeling well>> labs show FBS= 114, A1c=6.3 & he is encouraged to continue diet + exercise... he remains on Simva80 & recent FLP fair w/ LDL 122 (we discussed poss change to generic Lipitor later this yr)... he still smokes  ~1/4 ppd & encouraged to quit...  ~  Aug11:  he's had a good 49mo w/o new complaints or  concerns... BS at home all 100-140 range, and his A1c is 6.2 on diet alone... feeling well w/o CP, palpit/ SOB, etc... he needs to quit all smoking & incr exercise program, add ASA 81mg /d... we decided to switch his Simva80 to Lipitor40 at this time...   ~  July 31, 2010:  he states he had stopped checking his BS at home (prev all 100-120), feeling well, wt stable, still on diet (sort of)> then several weeks ago he checked his BS>300 x several days so he started taking his Metformin 1000mg  Bid again & BS improved to 150-180 range- no intercurrent infection or other obvious reason for the exac... labs ret w/ BS=168, A1c=9.2 & we discussed restarting METFORMIN 500mg  Bid along w/ strict DM diet, incr water intake etc...  still smoking & NEEDS TO QUIT... still taking the Simva80 w/ LDL 139 & NEEDS TO START LIP40.    Current Problems:   ALLERGIC RHINITIS (ICD-477.9) - mild symptoms... controlled w/ OTC Claritin or Zyrtek...  CIGARETTE SMOKER (ICD-305.1) - still smokes 1/4 ppd despite all efforts to get him to quit... he tried and failed Chantix in the past...   ~  1/10:  he tells me that he quit smoking 2 days ago, did it on his own!!!  ~  5/10:  he tells me now that he's smoking again, <  1/2 ppd... encouraged to quit!!!  ~  8/10:  back to 1/2 ppd and he feels this may be the best he can do.  ~  2/11-2/12:  down to 1/4 ppd now but can't seem to do any better.  HYPERLIPIDEMIA (ICD-272.4) - hx Chol >300 in the past... doing fair on diet + SIMVASTATIN 80mg > we rec change to LIPITOR 40mg /d but he hasn't filled the new Rx yet...  ~  FLP 12/07 on Lip40 showed TChol 176, TG 101, HDL 44, LDL 111  ~  FLP 1/09 on Lip40 showed TChol 209, TG 148, HDL 46, LDL 131... rec- change to Simva80.  ~  FLP 1/10 on Simva80 showed TChol 319, TG 726, HDL 53, LDL 106... rec- new Rx for DM.  ~  FLP 5/10 on Simva80 showed TChol 192, TG 83, HDL 48, LDL 128... same med, better diet.  ~  FLP 2/11 on Simva80 showed TChol 186,  TG 75, HDL 49, LDL 122  ~  FLP 8/11 on Simva80 showed TChol 208, TG 111, HDL 43, LDL 144... rec ch to Lipitor40 (he never did).  ~  FLP 2/12 on Simva80 showed TChol 204, TG 120, HDL 45, LDL 139... rec ch to Lip40 now.  DIABETES MELLITUS (ICD-250.00) - new Dx [name]> started on Metformin & Glimepiride;  he did very well w/ diet + exercise & weaned off all meds 10/10 & did very well until 2/12 when BS inexplicably went back up >300 & A1c=9.2- rec to restart Metformin.  ~  labs 1/10 showed BS= 386, HgA1c= 14.6.Marland Kitchen. rec> started Metformin1000Bid + diet/ exerc.  ~  labs 2/10 showed BS= 107, HgA1c= 10.7.Marland Kitchen. rec> add Glimep4mg /d.  ~  labs 5/10 showed BS= 107, HgA1c= 5.5.Marland Kitchen. rec> decr Glimep4 to 1/2 tabQam.  ~  labs 8/10 showed BS= 100, A1c= 5.4.Marland Kitchen. rec> stop the Glimep, & taper Metform.  ~  labs 2/11 showed BS= 114, A1c= 6.3.Marland KitchenMarland Kitchen continue diet + exercise.  ~  labs 8/11 showed BS= 100, A1c= 6.2  ~  labs 2/12 (back on Metform) showed BS= 168, A1c= 9.2.Marland Kitchen. rec> start Metform500Bid +diet, +water.  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  WRIST PAIN (ICD-719.43) - eval by DrKuzma w/ MRI showing scapholunate ligament disruption... Rx splint, Mobic, consideration of surgery.  Hx of HEADACHE (ICD-784.0)  Health Maintenance:  rec to take ASA 81mg /d...  ~  GI:  Age 19- no hx GI problems, hematochezia, etc...  ~  GU:  He is established w/ DrOttelin due to condyloma...  ~  Immunizations:  he is rec to get the yearly Flu vaccines...    Current Medications (verified): 1)  Aspirin 81 Mg Tabs (Aspirin) .... Take 1 Tab By Mouth Once Daily.Marland KitchenMarland Kitchen 2)  Simvastatin 80 Mg Tabs (Simvastatin) .Marland Kitchen.. 1 By Mouth Once Daily 3)  Bayer Contour Test  Strp (Glucose Blood) .... Use As Directed Once A Day 4)  Mucinex 600 Mg Xr12h-Tab (Guaifenesin) .... Take 2 By Mouth Two Times A Day With Plenty of Fluids 5)  Metformin Hcl 1000 Mg Tabs (Metformin  Hcl) .Marland Kitchen.. 1 By Mouth Two Times A Day 6)  Multivitamins  Tabs (Multiple Vitamin) .Marland Kitchen.. 1 By Mouth Once Daily 7)  Fish Oil 1200 Mg Caps (Omega-3 Fatty Acids) .Marland Kitchen.. 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 Father alive at age 49 w/ high cholesterol Mother alive at age 62 in good health 2 Siblings: 1 Brother w/ high cholesterol 1 Sister w/ high cholesterol, & incr BS on diet alone. ** there is a hx of DM in paternal grandfather **  Social History: Reviewed history from 01/30/2010 and no changes required. Married - wife Clydie Braun x 14 yrs no children smoker - states he quit 06/16/08 social Etoh  Review of Systems      See HPI  The patient denies anorexia, fever, weight loss, weight gain, vision loss, decreased hearing, hoarseness, chest pain, syncope, dyspnea on exertion, peripheral edema, prolonged cough, headaches, hemoptysis, abdominal pain, melena, hematochezia, severe indigestion/heartburn, hematuria, incontinence, muscle weakness, suspicious skin lesions, transient blindness, difficulty walking, depression, unusual weight change, abnormal bleeding, enlarged lymph nodes, and angioedema.    Vital Signs:  Patient profile:   45 year old male Height:      72 inches Weight:      186 pounds BMI:     25.32 O2 Sat:      98 % Temp:     97.8 degrees F oral Pulse rate:   76 / minute BP sitting:   140 / 70  (right arm) Cuff size:   regular  Vitals Entered By: Kandice Hams CMA (July 31, 2010 10:49 AM) CC: 6 month ROV & review of mult medical problems... Comments pt still  taking Simvastatin   Physical Exam  Additional Exam:  WD, WN, 45 y/o WM in NAD... GENERAL:  Alert & oriented; pleasant & cooperative... HEENT:  [place]/AT, EOM-wnl, PERRLA, EACs-clear, TMs-wnl, NOSE-clear, THROAT-clear & wnl. NECK:  Supple w/ full ROM; no JVD; normal carotid impulses w/o bruits; no thyromegaly or nodules palpated; no lymphadenopathy. CHEST:  Clear to P & A; without wheezes/ rales/ or rhonchi. HEART:  Regular Rhythm; without murmurs/ rubs/ or gallops. ABDOMEN:  Soft & nontender; normal bowel sounds; no organomegaly or masses detected. EXT: without deformities, mild arthritic change in R wrist; no varicose veins/ venous insuffic/ or edema. NEURO:  CN's intact, no focal neuro deficits... SKIN:  WNL, no lesions seen...    Impression & Recommendations:  Problem # 1:  DIABETES MELLITUS (ICD-250.00) Very surprizing to see DM flair back like this w/o cause (no infection, on diet, wt same, etc)... in any event we discussed low carb diet, get wt down further, & start METFORMIN 500mg Bid... monitor sugar at home & call for questions... His updated medication list for this problem includes:    Aspirin 81 Mg Tabs (Aspirin) .Marland Kitchen... Take 1 tab by mouth once daily...    Metformin Hcl 500 Mg Tabs (Metformin hcl) .Marland Kitchen... Take 1 tab by mouth two times a day  Problem # 2:  HYPERLIPIDEMIA (ICD-272.4) His LDL is still  ~140 but he is still on Simva80 (must of had one huge stockpile!!!).Marland Kitchen. rec to stop the Simva80 & switch to Lip 40 as discussed... His updated medication list for this problem includes:    Lipitor 40 Mg Tabs (Atorvastatin calcium) .Marland Kitchen... Take 1 tab by mouth once daily...  Problem # 3:  CIGARETTE SMOKER (ICD-305.1) Must quit all smoking & we discussed smoking cessation options...  Problem # 4:  OTHER MEDICAL ISSUES AS NOTED>>>  Complete Medication List: 1)  Aspirin 81 Mg Tabs (Aspirin) .... Take 1 tab  by mouth once daily.Marland KitchenMarland Kitchen 2)  Lipitor 40 Mg Tabs (Atorvastatin calcium) .... Take 1  tab by mouth once daily.Marland KitchenMarland Kitchen 3)  IT consultant Strp (Glucose blood) .... Use as directed once a day 4)  Mucinex 600 Mg Xr12h-tab (Guaifenesin) .... Take 2 by mouth two times a day with plenty of fluids 5)  Metformin Hcl 500 Mg Tabs (Metformin hcl) .... Take 1 tab by mouth two times a day 6)  Multivitamins Tabs (Multiple vitamin) .Marland Kitchen.. 1 by mouth once daily 7)  Fish Oil 1200 Mg Caps (Omega-3 fatty acids) .Marland Kitchen.. 1 by mouth once daily  Patient Instructions: 1)  Today we updated your med list- see below.... 2)  We are waiting for today's labs to return, but based on your recent increase in the blood sugar let's start back on METFORMIN at 500mg  two times a day... 3)  Get back on your Diabetic diet> no sugars, no sweets, avoid pasta/ bread/ white flour products etc... 4)  Also> let's start on the LIPITOR 40mg  one tab daily & stop the Simvastatin.Marland KitchenMarland Kitchen 5)  Continue to check your BS at home & call for questions.Marland KitchenMarland Kitchen 6)  Let's plan a brief check up in  3months to be sure we are back on track... Prescriptions: METFORMIN HCL 500 MG TABS (METFORMIN HCL) take 1 tab by mouth two times a day  #60 x 12   Entered and Authorized by:   Michele Mcalpine MD   Signed by:   Michele Mcalpine MD on 07/31/2010   Method used:   Print then Give to Patient   RxID:   [id number] LIPITOR 40 MG TABS (ATORVASTATIN CALCIUM) take 1 tab by mouth once daily...  #30 x 12   Entered and Authorized by:   Michele Mcalpine MD   Signed by:   Michele Mcalpine MD on 07/31/2010   Method used:   Print then Give to Patient   RxID:   [id number]

## 2010-08-11 NOTE — Progress Notes (Signed)
Summary: test strips  Phone Note Call from Patient Call back at Home Phone [phone]   Caller: Spouse-karen Call For: young Reason for Call: Refill Medication Summary of Call: Patient need diabetic test strips-contour-[facility] Initial call taken by: Lehman Prom,  August 05, 2010 10:49 AM  Follow-up for Phone Call        calld and spoke with pts wife and rx for the test strips have  been sent to the pharmacy Randell Loop CMA  August 05, 2010 11:44 AM     New/Updated Medications: BAYER CONTOUR TEST  STRP (GLUCOSE BLOOD) check blood sugar as directed Prescriptions: BAYER CONTOUR TEST  STRP (GLUCOSE BLOOD) check blood sugar as directed  #50 x 6   Entered by:   Randell Loop CMA   Authorized by:   Michele Mcalpine MD   Signed by:   Randell Loop CMA on 08/05/2010   Method used:   Electronically to        [facility]* (retail)       [address] [place], [place]  [address]       Ph: [phone] or [phone]       Fax: [phone]   RxID:   [id number]

## 2010-10-30 ENCOUNTER — Ambulatory Visit: Payer: BC Managed Care – PPO | Admitting: Pulmonary Disease

## 2010-11-04 ENCOUNTER — Encounter: Payer: Self-pay | Admitting: Pulmonary Disease

## 2010-11-13 ENCOUNTER — Encounter: Payer: Self-pay | Admitting: Pulmonary Disease

## 2010-11-13 ENCOUNTER — Other Ambulatory Visit (INDEPENDENT_AMBULATORY_CARE_PROVIDER_SITE_OTHER): Payer: BC Managed Care – PPO

## 2010-11-13 ENCOUNTER — Ambulatory Visit (INDEPENDENT_AMBULATORY_CARE_PROVIDER_SITE_OTHER): Payer: BC Managed Care – PPO | Admitting: Pulmonary Disease

## 2010-11-13 ENCOUNTER — Telehealth: Payer: Self-pay | Admitting: Pulmonary Disease

## 2010-11-13 VITALS — BP 126/84 | HR 84 | Temp 98.2°F | Ht 72.0 in | Wt 186.4 lb

## 2010-11-13 DIAGNOSIS — R748 Abnormal levels of other serum enzymes: Secondary | ICD-10-CM

## 2010-11-13 DIAGNOSIS — J309 Allergic rhinitis, unspecified: Secondary | ICD-10-CM

## 2010-11-13 DIAGNOSIS — E039 Hypothyroidism, unspecified: Secondary | ICD-10-CM

## 2010-11-13 DIAGNOSIS — E785 Hyperlipidemia, unspecified: Secondary | ICD-10-CM

## 2010-11-13 DIAGNOSIS — A630 Anogenital (venereal) warts: Secondary | ICD-10-CM

## 2010-11-13 DIAGNOSIS — F17200 Nicotine dependence, unspecified, uncomplicated: Secondary | ICD-10-CM

## 2010-11-13 DIAGNOSIS — E119 Type 2 diabetes mellitus without complications: Secondary | ICD-10-CM

## 2010-11-13 LAB — CBC WITH DIFFERENTIAL/PLATELET
Basophils Absolute: 0 10*3/uL (ref 0.0–0.1)
Eosinophils Absolute: 0.3 10*3/uL (ref 0.0–0.7)
Lymphocytes Relative: 32.8 % (ref 12.0–46.0)
Monocytes Relative: 6.8 % (ref 3.0–12.0)
Neutrophils Relative %: 56.1 % (ref 43.0–77.0)
Platelets: 196 10*3/uL (ref 150.0–400.0)
RDW: 12.7 % (ref 11.5–14.6)

## 2010-11-13 LAB — HEPATIC FUNCTION PANEL
ALT: 30 U/L (ref 0–53)
AST: 22 U/L (ref 0–37)
Albumin: 3.9 g/dL (ref 3.5–5.2)
Alkaline Phosphatase: 100 U/L (ref 39–117)
Total Protein: 6.4 g/dL (ref 6.0–8.3)

## 2010-11-13 LAB — LIPID PANEL
Cholesterol: 152 mg/dL (ref 0–200)
LDL Cholesterol: 82 mg/dL (ref 0–99)
Triglycerides: 126 mg/dL (ref 0.0–149.0)

## 2010-11-13 LAB — BASIC METABOLIC PANEL
Calcium: 9.3 mg/dL (ref 8.4–10.5)
Chloride: 104 mEq/L (ref 96–112)
Creatinine, Ser: 0.9 mg/dL (ref 0.4–1.5)
GFR: 93.12 mL/min (ref >60.00)

## 2010-11-13 LAB — TSH: TSH: 0.8 u[IU]/mL (ref 0.35–5.50)

## 2010-11-13 LAB — HEMOGLOBIN A1C: Hgb A1c MFr Bld: 6.8 % — ABNORMAL HIGH (ref 4.6–6.5)

## 2010-11-13 NOTE — Patient Instructions (Signed)
Today we updated your med list in EPIC...    Continue your current meds the same for now...  Today we did your follow up fasting blood work...    Please call the PHONE TREE in a few days for your results...    Dial [id number] & when prompted enter your patient number followed by the # symbol...    Your patient number is:  [id number]#  Keep up the good work w/ your diet & exercise program...  Jerome Waller, you need to QUIT the smoking!!!  We will set up an appt for you to see our diabetic specialist- DrEllison...    In the meanwhile you should see your eye doctor for a diabetic eye exam.  Call for any questions...  Let's plan a follow up visit in 6 months, sooner if needed for problems.Marland KitchenMarland Kitchen

## 2010-11-13 NOTE — Telephone Encounter (Signed)
Okay to come in for labs fasting before 12 noon appointment for A1C, BMP, Lipid, Hepat, CBC w/D, and TSH. Pt informed to come in now for fasting labs.

## 2010-11-13 NOTE — Progress Notes (Signed)
Subjective:    Patient ID: Jerome Waller, male    DOB: 01/22/1966, 45 y.o.   MRN: [id number]  HPI 45 y/o WM here for a follow up visit... he has multiple medical problems as noted below...    ~   [name]:  feeling bad ever since Thanksgiving when he "got the crud"-  c/o fatigue, sore throat, sl cough w/ beige phlegm, abd discomfort, no n/v/d... he denies f/c/s but had decreased appetite, increased thirst, dry mouth and polyuria... he has noted a 25# weight loss... his mother-in-law is diabetic and checked his BS which was HHH- he put himself on a diet and rechecked 294... he waited til after the holidays to call us... BS= 386, A1c= 14.6.Marland Kitchen. started on Metformin 500Bid + diet etc... ~  Feb10:  he states much improved over the last month- he called [place] after 2 weeks and Metformin incr to 1000mg Bid & Glimepiride 4mg /d added... FBS down to 127-159 range and 2H pc = 99-192... weight stable at 183#... feeling better overall... ~  May10:  doing well for the last several months... weight stabilize in the 180-185 range, and BS's range 80 to 140 but he notes ?hypoglycemic at 9-10AM at work (can't check BS but symptoms compatible & feels better after eating... labs today w/ BS= 109 & A1c= 5.5, so we will decr the Gliep4 to 1/2 Qam... ~  Aug10:  continues to do well w/ diet/ exercise/ meds- BS at home 90-120 w/ 100 ave and labs here 8/27 showed FBS= 100, A1c= 5.4.Marland KitchenMarland Kitchen we discussed stopping the Glimepiride and continuing the Metformin 1000mg  Bid...  ~  QMV78:  he has stayed on diet + exercise and weaned off the Metformin over the last 44mo (stopped 10/10)- now on diet alone... BS remains 100-120 & feeling well>> labs show FBS= 114, A1c=6.3 & he is encouraged to continue diet + exercise... he remains on Simva80 & recent FLP fair w/ LDL 122 (we discussed poss change to generic Lipitor later this yr)... he still smokes ~1/4 ppd & encouraged to quit... ~  Aug11:  he's had a good 44mo w/o new complaints or concerns... BS at  home all 100-140 range, and his A1c is 6.2 on diet alone... feeling well w/o CP, palpit/ SOB, etc... he needs to quit all smoking & incr exercise program, add ASA 81mg /d... we decided to switch his Simva80 to Lipitor40 at this time...  ~  July 31, 2010:  he states he had stopped checking his BS at home (prev all 100-120), feeling well, wt stable, still on diet (sort of)> then several weeks ago he checked his BS>300 x several days so he started taking his Metformin 1000mg  Bid again & BS improved to 150-180 range- no intercurrent infection or other obvious reason for the exac... labs ret w/ BS=168, A1c=9.2 & we discussed restarting METFORMIN 500mg  Bid along w/ strict DM diet, incr water intake etc...  still smoking & NEEDS TO QUIT... still taking the Simva80 w/ LDL 139 & NEEDS TO START LIP40.  ~  November 13, 2010:  3.73mo ROV & back on diet etc w/ improvement in accuchecks on his Metformin 500mg Bid, he felt that his sugars were too low so he cut back to 1/2 tab Bid & feels better on this dose w/ BS 90-105 he says; todays labs reveal BS=138, A1c=6.8, & OK to continue same dose + diet & exercise; he tells me that his family (father & sister) is pressuring him to see a diabetic specialist since "they really  know how to pin point these things" (what?) & we will set up appt at his request...  Lipids look good on Lip40> see labs below; advised to continue diet & exercise program; advised to QUIT SMOKING; advised DM eye exam etc...  Problem List:  ALLERGIC RHINITIS (ICD-477.9) - mild symptoms... controlled w/ OTC Claritin or Zyrtek...  CIGARETTE SMOKER (ICD-305.1) - still smokes 1/4-1/2 ppd despite all efforts to get him to quit... he tried and failed Chantix in the past...  ~  1/10:  he tells me that he quit smoking 2 days ago, did it on his own!!! ~  5/10:  he tells me now that he's smoking again, < 1/2 ppd... encouraged to quit!!! ~  8/10:  back to 1/2 ppd and he feels this may be the best he can do. ~   2/11-2/12:  down to 1/4 ppd now but can't seem to do any better; offered smoking cessation counselling etc.  HYPERLIPIDEMIA (ICD-272.4) - hx Chol >300 in the past... doing fair on diet + SIMVASTATIN 80mg > we rec change to LIPITOR 40mg /d but he hasn't filled the new Rx yet... ~  FLP 12/07 on Lip40 showed TChol 176, TG 101, HDL 44, LDL 111 ~  FLP 1/09 on Lip40 showed TChol 209, TG 148, HDL 46, LDL 131... rec- change to Simva80. ~  FLP 1/10 on Simva80 showed TChol 319, TG 726, HDL 53, LDL 106... rec- new Rx for DM. ~  FLP 5/10 on Simva80 showed TChol 192, TG 83, HDL 48, LDL 128... same med, better diet. ~  FLP 2/11 on Simva80 showed TChol 186, TG 75, HDL 49, LDL 122 ~  FLP 8/11 on Simva80 showed TChol 208, TG 111, HDL 43, LDL 144... rec ch to Lipitor40 (he never did). ~  FLP 2/12 on Simva80 showed TChol 204, TG 120, HDL 45, LDL 139... rec ch to Lip40 now. ~  FLP 6/12 on Lip40 showed TChol 152, TG 126, HDL 45, LDL 82... Continue Lip40 + diet & exerc.  DIABETES MELLITUS (ICD-250.00) - new Dx [name]> started on Metformin & Glimepiride;  he did very well w/ diet + exercise & weaned off all meds 10/10 & did very well until 2/12 when BS inexplicably went back up >300 & A1c=9.2- rec to restart Metformin. ~  labs 1/10 showed BS= 386, HgA1c= 14.6.Marland Kitchen. rec> started Metformin1000Bid + diet/ exerc. ~  labs 2/10 showed BS= 107, HgA1c= 10.7.Marland Kitchen. rec> add Glimep4mg /d. ~  labs 5/10 showed BS= 107, HgA1c= 5.5.Marland Kitchen. rec> decr Glimep4 to 1/2 tabQam. ~  labs 8/10 showed BS= 100, A1c= 5.4.Marland Kitchen. rec> stop the Glimep, & taper Metform. ~  labs 2/11 showed BS= 114, A1c= 6.3.Marland KitchenMarland Kitchen continue diet + exercise. ~  labs 8/11 showed BS= 100, A1c= 6.2 ~  labs 2/12 (back on Metform) showed BS= 168, A1c= 9.2.Marland Kitchen. rec> start Metform500Bid +diet, +water. ~  Labs 6/1 on Metform250Bid showed BS= 138, A1c= 6.8.Marland KitchenMarland Kitchen OK to continue same dose + diet/ exerc.  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  WRIST PAIN (ICD-719.43) - eval by DrKuzma w/ MRI showing scapholunate ligament disruption... Rx splint, Mobic, consideration of surgery.  Hx of HEADACHE (ICD-784.0)  Health Maintenance:  rec to take ASA 81mg /d... ~  GI:  Age 23- no hx GI problems, hematochezia, etc... ~  GU:  He is established w/ DrOttelin due to condyloma... ~  Immunizations:  he is rec to get the yearly Flu  vaccines...    Past Surgical History  Procedure Date  . Mandible surgery 1988    for underbite    Outpatient Encounter Prescriptions as of 11/13/2010  Medication Sig Dispense Refill  . aspirin 81 MG tablet Take 81 mg by mouth daily.        Marland Kitchen atorvastatin (LIPITOR) 40 MG tablet Take 40 mg by mouth daily.        Marland Kitchen glucose blood (BAYER CONTOUR TEST) test strip Use as instructed to check blood sugar       . guaiFENesin (MUCINEX) 600 MG 12 hr tablet Take 1,200 mg by mouth 2 (two) times daily.        . metFORMIN (GLUCOPHAGE) 500 MG tablet Take 500 mg by mouth 2 (two) times daily with a meal.    ==> pt cut back to 1/2 Bid on his own...    . Multiple Vitamin (MULTIVITAMIN) capsule Take 1 capsule by mouth daily.        . Omega-3 Fatty Acids (FISH OIL) 1200 MG CAPS Take 1 capsule by mouth daily.          Allergies ==> No Known Drug Allergies...   Review of Systems        See HPI - all other systems neg except as noted... The patient denies anorexia, fever, weight loss, weight gain, vision loss, decreased hearing, hoarseness, chest pain, syncope, dyspnea on exertion, peripheral edema, prolonged cough, headaches, hemoptysis, abdominal pain, melena, hematochezia, severe indigestion/heartburn, hematuria, incontinence, muscle weakness, suspicious skin lesions, transient blindness, difficulty walking, depression, unusual weight change, abnormal bleeding, enlarged lymph nodes, and angioedema.    Objective:   Physical Exam      WD, WN, 45 y/o WM in NAD... GENERAL:  Alert &  oriented; pleasant & cooperative... HEENT:  [place]/AT, EOM-wnl, PERRLA, EACs-clear, TMs-wnl, NOSE-clear, THROAT-clear & wnl. NECK:  Supple w/ full ROM; no JVD; normal carotid impulses w/o bruits; no thyromegaly or nodules palpated; no lymphadenopathy. CHEST:  Clear to P & A; without wheezes/ rales/ or rhonchi. HEART:  Regular Rhythm; without murmurs/ rubs/ or gallops. ABDOMEN:  Soft & nontender; normal bowel sounds; no organomegaly or masses detected. EXT: without deformities, mild arthritic change in R wrist; no varicose veins/ venous insuffic/ or edema. NEURO:  CN's intact, no focal neuro deficits... SKIN:  WNL, no lesions seen...   Assessment & Plan:   SMOKER>  We discussed smoking cessation strategies but he is not motivated to quit...  HYPERLIPIDEMIA>  FLP much improved on Lip40 + his diet/ exercise/ etc...  DM>  He cut back on the Metformin on his own to 1/2 Bid & states that he feels better on this dose; A1c=6.8, OK to continue same;  He is requesting a Diabetic specialist to evaluate his diabetes & we will set this up at his request...  Other medical issues as noted.Marland KitchenMarland Kitchen

## 2010-11-14 ENCOUNTER — Encounter: Payer: Self-pay | Admitting: Pulmonary Disease

## 2010-12-03 ENCOUNTER — Ambulatory Visit: Payer: BC Managed Care – PPO | Admitting: Endocrinology

## 2010-12-11 ENCOUNTER — Encounter: Payer: Self-pay | Admitting: Endocrinology

## 2010-12-11 ENCOUNTER — Ambulatory Visit (INDEPENDENT_AMBULATORY_CARE_PROVIDER_SITE_OTHER): Payer: BC Managed Care – PPO | Admitting: Endocrinology

## 2010-12-11 VITALS — BP 124/82 | HR 85 | Temp 98.1°F | Ht 72.0 in | Wt 188.6 lb

## 2010-12-11 DIAGNOSIS — E119 Type 2 diabetes mellitus without complications: Secondary | ICD-10-CM

## 2010-12-11 MED ORDER — METFORMIN HCL ER 500 MG PO TB24: 500.0000 mg | ORAL_TABLET | Freq: Two times a day (BID) | ORAL | Status: DC

## 2010-12-11 NOTE — Progress Notes (Signed)
Addended by: Romero Belling on: 12/11/2010 01:48 PM   Modules accepted: Orders

## 2010-12-11 NOTE — Patient Instructions (Addendum)
good diet and exercise habits significanly improve the control of your diabetes.  please let me know if you wish to be referred to a dietician.  high blood sugar is very risky to your health.  you should see an eye doctor every year. controlling your blood pressure and cholesterol drastically reduces the damage diabetes does to your body.  this also applies to quitting smoking.  please discuss these with your doctor.  you should take an aspirin every day, unless you have been advised by a doctor not to. check your blood sugar 1 time a day.  vary the time of day when you check, between before the 3 meals, and at bedtime.  also check if you have symptoms of your blood sugar being too high or too low.  please keep a record of the readings and bring it to your next appointment here.  please call us sooner if you are having low blood sugar episodes. Please make a follow-up appointment in 3 months. It i likely that you will eventually develop insulin-requiring diabetes, so please cal if the blood-sugar stays high.  For now, change metformin to an extended-release form, 500 mg each morning.  If you do not have any low-blood-sugar episodes, try to increase it to 1 pill 2x a day.

## 2010-12-11 NOTE — Progress Notes (Signed)
Subjective:    Patient ID: Jerome Waller, male    DOB: 1965-08-29, 45 y.o.   MRN: [id number]  HPI pt states 3 years h/o dm.  He presented with weight loss and severe hyperglycemia.  he is unaware of any chronic complications.  he has never been on insulin.  he takes metformin sparingly, due to hypoglycemia.  pt says his diet and exercise are "good."  Symptomatically, he has many years of moderately excessive diaphoresis, throughout the body, but no assoc heat intolerance.  Past Medical History  Diagnosis Date  . Allergic rhinitis   . Cigarette smoker   . Hyperlipidemia   . DM (diabetes mellitus)   . History of condyloma acuminatum   . Wrist pain   . History of headache     Past Surgical History  Procedure Date  . Mandible surgery 1988    for underbite    History   Social History  . Marital Status: Married    Spouse Name: Giani Betzold    Number of Children: N/A  . Years of Education: HS   Occupational History  . Chemical Operator    Social History Main Topics  . Smoking status: Current Everyday Smoker -- 0.5 packs/day    Types: Cigarettes    Last Attempt to Quit: 06/16/2008  . Smokeless tobacco: Never Used   Comment: started back smoking  . Alcohol Use: Yes     social  . Drug Use: No  . Sexually Active: Not on file   Other Topics Concern  . Not on file   Social History Narrative   Married to Clydie Braun 14+ years (also a patient of [facility])    Current Outpatient Prescriptions on File Prior to Visit  Medication Sig Dispense Refill  . atorvastatin (LIPITOR) 40 MG tablet Take 40 mg by mouth daily.        Marland Kitchen glucose blood (BAYER CONTOUR TEST) test strip Use as instructed to check blood sugar       . aspirin 81 MG tablet Take 81 mg by mouth daily.        . metFORMIN (GLUCOPHAGE) 500 MG tablet Take 1/2 tablet by mouth two times daily      . Multiple Vitamin (MULTIVITAMIN) capsule Take 1 capsule by mouth daily.        . Omega-3 Fatty Acids (FISH OIL) 1200 MG CAPS Take  1 capsule by mouth daily.          No Known Allergies  Family History  Problem Relation Age of Onset  . Hyperlipidemia Father   . Hyperlipidemia Brother   . Hyperlipidemia Sister     and increased BS on diet alone  . Diabetes Paternal Grandfather   dm: not in immediate family.  BP 124/82  Pulse 85  Temp(Src) 98.1 F (36.7 C) (Oral)  Ht 6' (1.829 m)  Wt 188 lb 9.6 oz (85.548 kg)  BMI 25.58 kg/m2  SpO2 96%  Review of Systems denies weight loss, blurry vision, headache, chest pain, sob, n/v, urinary frequency, cramps, rash, difficulty with concentration, depression, erectile dysfunction, bruising, rhinorrhea, and numbness.     Objective:   Physical Exam VS: see vs page GEN: no distress HEAD: head: no deformity eyes: no periorbital swelling, no proptosis external nose and ears are normal mouth: no lesion seen NECK: supple, thyroid is not enlarged CHEST WALL: no deformity BREASTS:  No gynecomastia CV: reg rate and rhythm, no murmur ABD: abdomen is soft, nontender.  no hepatosplenomegaly.  not distended.  no hernia MUSCULOSKELETAL: muscle bulk and strength are grossly normal.  no obvious joint swelling.  gait is normal and steady EXTEMITIES: no deformity.  no ulcer on the feet.  feet are of normal color and temp.  no edema PULSES: dorsalis pedis intact bilat.  no carotid bruit NEURO:  cn 2-12 grossly intact.   readily moves all 4's.  sensation is intact to touch on the feet SKIN:  Normal texture and temperature.  No rash or suspicious lesion is visible.   NODES:  None palpable at the neck PSYCH: alert, oriented x3.  Does not appear anxious nor depressed.     Lab Results  Component Value Date   HGBA1C 6.8* 11/13/2010   Assessment & Plan:  Dm, he needs increased rx, Needs increased rx, if it can be done with a regimen that avoids or minimizes hypoglycemia.  It is likely that he is evolving type 1 dm.   Smoker.  This exacerbates the effects of dm Diaphoresis, unlikely  related to dm

## 2010-12-24 ENCOUNTER — Telehealth: Payer: Self-pay | Admitting: Pulmonary Disease

## 2010-12-24 DIAGNOSIS — M549 Dorsalgia, unspecified: Secondary | ICD-10-CM

## 2010-12-24 MED ORDER — METAXALONE 800 MG PO TABS: 800.0000 mg | ORAL_TABLET | Freq: Three times a day (TID) | ORAL | Status: AC | PRN

## 2010-12-24 NOTE — Telephone Encounter (Signed)
Spoke with Jerome Waller and notified order sent to [facility] for ortho referral. Pt would like to see seen tomorrow, but TP and SN out of the office. I offered ov with TP for Monday, but she states he can not wait until then, and is requesting rx for muscle relaxer in the meantime. Please advise, thanks!

## 2010-12-24 NOTE — Telephone Encounter (Signed)
That is fine , can refer to ortho.  Will be glad to see if they would like.  Please contact office for sooner follow up if symptoms do not improve or worsen or seek emergency care

## 2010-12-24 NOTE — Telephone Encounter (Signed)
Spoke with pt's spouse and notified of recs per TP. She vebalized understanding.  Went ahead and sched appt with TP for 12/28/10. Rx for skelaxin was sent to pharm.

## 2010-12-24 NOTE — Telephone Encounter (Signed)
Spoke with pt's spouse. She states that pt has had worsening back pain for the past 2 wks. She states that he has been using heating pad with no help. He states that pt can not even bend over due to severity of pain and he is requesting referral to ortho. Will forward msg to TP to address is SN's absence. Please advise, thanks!

## 2010-12-24 NOTE — Telephone Encounter (Signed)
May have Skelaxin 800mg  #30  1 po Three times a day  As needed  Muscle spasm, no refills.  Advil 200mg  3 tabs w/ food Twice daily   Please contact office for sooner follow up if symptoms do not improve or worsen or seek emergency care

## 2010-12-28 ENCOUNTER — Ambulatory Visit: Payer: BC Managed Care – PPO | Admitting: Adult Health

## 2011-03-05 ENCOUNTER — Other Ambulatory Visit (INDEPENDENT_AMBULATORY_CARE_PROVIDER_SITE_OTHER): Payer: BC Managed Care – PPO

## 2011-03-05 ENCOUNTER — Ambulatory Visit (INDEPENDENT_AMBULATORY_CARE_PROVIDER_SITE_OTHER): Payer: BC Managed Care – PPO | Admitting: Endocrinology

## 2011-03-05 ENCOUNTER — Encounter: Payer: Self-pay | Admitting: Endocrinology

## 2011-03-05 VITALS — BP 138/98 | HR 76 | Temp 98.6°F | Ht 72.0 in | Wt 188.4 lb

## 2011-03-05 DIAGNOSIS — E119 Type 2 diabetes mellitus without complications: Secondary | ICD-10-CM

## 2011-03-05 MED ORDER — PIOGLITAZONE HCL 45 MG PO TABS: 45.0000 mg | ORAL_TABLET | Freq: Every day | ORAL | Status: DC

## 2011-03-05 NOTE — Progress Notes (Signed)
  Subjective:    Patient ID: Jerome Waller, male    DOB: 04/12/1966, 45 y.o.   MRN: [id number]  HPI pt states he feels well in general.  no cbg record, but states cbg's are well-controlled.  denies hypoglycemia. Past Medical History  Diagnosis Date  . Allergic rhinitis   . Cigarette smoker   . Hyperlipidemia   . DM (diabetes mellitus)   . History of condyloma acuminatum   . Wrist pain   . History of headache     Past Surgical History  Procedure Date  . Mandible surgery 1988    for underbite    History   Social History  . Marital Status: Married    Spouse Name: Rosalie Gelpi    Number of Children: N/A  . Years of Education: HS   Occupational History  . Chemical Operator    Social History Main Topics  . Smoking status: Current Everyday Smoker -- 0.5 packs/day for 21 years    Types: Cigarettes    Last Attempt to Quit: 06/16/2008  . Smokeless tobacco: Never Used   Comment: started back smoking  . Alcohol Use: Yes     social  . Drug Use: No  . Sexually Active: Not on file   Other Topics Concern  . Not on file   Social History Narrative   Married to Clydie Braun 14+ years (also a patient of [facility])    Current Outpatient Prescriptions on File Prior to Visit  Medication Sig Dispense Refill  . aspirin 81 MG tablet Take 81 mg by mouth daily.        Marland Kitchen atorvastatin (LIPITOR) 40 MG tablet Take 40 mg by mouth daily.        Marland Kitchen glucose blood (BAYER CONTOUR TEST) test strip Use as instructed to check blood sugar       . metFORMIN (GLUCOPHAGE-XR) 500 MG 24 hr tablet Take 1 tablet (500 mg total) by mouth 2 (two) times daily.  60 tablet  11  . Multiple Vitamin (MULTIVITAMIN) capsule Take 1 capsule by mouth daily.        . Omega-3 Fatty Acids (FISH OIL) 1200 MG CAPS Take 1 capsule by mouth daily.          No Known Allergies  Family History  Problem Relation Age of Onset  . Hyperlipidemia Father   . Hyperlipidemia Brother   . Hyperlipidemia Sister     and increased BS on diet  alone  . Diabetes Paternal Grandfather    BP 138/98  Pulse 76  Temp(Src) 98.6 F (37 C) (Oral)  Ht 6' (1.829 m)  Wt 188 lb 6.4 oz (85.458 kg)  BMI 25.55 kg/m2  SpO2 96%  Review of Systems Denies weight change.     Objective:   Physical Exam VITAL SIGNS:  See vs page GENERAL: no distress LUNGS:  Clear to auscultation HEART:  Regular rate and rhythm without murmurs noted. Normal S1,S2.    Lab Results  Component Value Date   HGBA1C 7.2* 03/05/2011      Assessment & Plan:  Dm, worse.  He has a high risk of developing type 1 dm

## 2011-03-05 NOTE — Patient Instructions (Addendum)
check your blood sugar 1 time a day.  vary the time of day when you check, between before the 3 meals, and at bedtime.  also check if you have symptoms of your blood sugar being too high or too low.  please keep a record of the readings and bring it to your next appointment here.  please call us sooner if you are having low blood sugar episodes.   Please make a follow-up appointment in 6 months.    It i likely that you will eventually develop insulin-requiring diabetes, so please cal if the blood-sugar stays high.   (update: i left message on phone-tree:  Add actos 45/d.  Recheck a1c in 3 mos.  have bp recheck with dr nadel 1-2 weeks).

## 2011-03-10 ENCOUNTER — Telehealth: Payer: Self-pay | Admitting: Endocrinology

## 2011-03-10 MED ORDER — SITAGLIPTIN PHOSPHATE 100 MG PO TABS: 100.0000 mg | ORAL_TABLET | Freq: Every day | ORAL | Status: DC

## 2011-03-10 NOTE — Telephone Encounter (Signed)
Pt's spouse informed of new rx.  

## 2011-03-10 NOTE — Telephone Encounter (Signed)
Called to inform pt, unable to leave message. (number busy)

## 2011-03-10 NOTE — Telephone Encounter (Signed)
please call patient: Insurance blocked actos.  i changed to januvia 100 mg qam, and i sent rx

## 2011-03-12 ENCOUNTER — Ambulatory Visit: Payer: BC Managed Care – PPO | Admitting: Endocrinology

## 2011-05-21 ENCOUNTER — Ambulatory Visit: Payer: BC Managed Care – PPO | Admitting: Pulmonary Disease

## 2011-08-05 ENCOUNTER — Telehealth: Payer: Self-pay | Admitting: Pulmonary Disease

## 2011-08-05 DIAGNOSIS — E119 Type 2 diabetes mellitus without complications: Secondary | ICD-10-CM

## 2011-08-05 DIAGNOSIS — E785 Hyperlipidemia, unspecified: Secondary | ICD-10-CM

## 2011-08-05 NOTE — Telephone Encounter (Signed)
Per SN: FLP, BMET, LFT, CBCD, TSH, A1C, Micro Albumin.  Called spoke with patient's wife, advised labs will be ordered for tomorrow.  Clydie Braun (pt's wife) asked that if pt is unable to come tomorrow d/t inclement weather if he may come next Friday.  Yes, orders will expire in 1 year.

## 2011-08-05 NOTE — Telephone Encounter (Signed)
Pt scheduled for OV on 08/20/11 and would like to come in tomorrow morning for fasting blood work. Pls advise.

## 2011-08-13 ENCOUNTER — Other Ambulatory Visit (INDEPENDENT_AMBULATORY_CARE_PROVIDER_SITE_OTHER): Payer: BC Managed Care – PPO

## 2011-08-13 DIAGNOSIS — E119 Type 2 diabetes mellitus without complications: Secondary | ICD-10-CM

## 2011-08-13 DIAGNOSIS — E785 Hyperlipidemia, unspecified: Secondary | ICD-10-CM

## 2011-08-13 LAB — CBC WITH DIFFERENTIAL/PLATELET
Basophils Absolute: 0 10*3/uL (ref 0.0–0.1)
Basophils Relative: 0.3 % (ref 0.0–3.0)
Eosinophils Absolute: 0.2 10*3/uL (ref 0.0–0.7)
HCT: 51.9 % (ref 39.0–52.0)
Hemoglobin: 18 g/dL (ref 13.0–17.0)
Lymphs Abs: 2.6 10*3/uL (ref 0.7–4.0)
MCHC: 34.7 g/dL (ref 30.0–36.0)
MCV: 96 fl (ref 78.0–100.0)
Neutro Abs: 3.2 10*3/uL (ref 1.4–7.7)
RBC: 5.4 Mil/uL (ref 4.22–5.81)
RDW: 12.5 % (ref 11.5–14.6)

## 2011-08-13 LAB — BASIC METABOLIC PANEL
CO2: 29 mEq/L (ref 19–32)
Calcium: 9.3 mg/dL (ref 8.4–10.5)
GFR: 105.66 mL/min (ref >60.00)
Glucose, Bld: 261 mg/dL — ABNORMAL HIGH (ref 70–99)
Potassium: 4.6 mEq/L (ref 3.5–5.1)
Sodium: 136 mEq/L (ref 135–145)

## 2011-08-13 LAB — HEPATIC FUNCTION PANEL
AST: 17 U/L (ref 0–37)
Albumin: 4.2 g/dL (ref 3.5–5.2)
Alkaline Phosphatase: 114 U/L (ref 39–117)
Bilirubin, Direct: 0.2 mg/dL (ref 0.0–0.3)

## 2011-08-13 LAB — LIPID PANEL: Total CHOL/HDL Ratio: 3

## 2011-08-13 LAB — MICROALBUMIN / CREATININE URINE RATIO
Creatinine,U: 299.6 mg/dL
Microalb Creat Ratio: 0.5 mg/g (ref 0.0–30.0)
Microalb, Ur: 1.5 mg/dL (ref 0.0–1.9)

## 2011-08-20 ENCOUNTER — Ambulatory Visit (INDEPENDENT_AMBULATORY_CARE_PROVIDER_SITE_OTHER): Payer: BC Managed Care – PPO | Admitting: Pulmonary Disease

## 2011-08-20 ENCOUNTER — Encounter: Payer: Self-pay | Admitting: Pulmonary Disease

## 2011-08-20 VITALS — BP 140/80 | HR 90 | Temp 97.5°F | Ht 72.0 in | Wt 177.0 lb

## 2011-08-20 DIAGNOSIS — M545 Low back pain: Secondary | ICD-10-CM

## 2011-08-20 DIAGNOSIS — E119 Type 2 diabetes mellitus without complications: Secondary | ICD-10-CM

## 2011-08-20 DIAGNOSIS — F17200 Nicotine dependence, unspecified, uncomplicated: Secondary | ICD-10-CM

## 2011-08-20 DIAGNOSIS — E785 Hyperlipidemia, unspecified: Secondary | ICD-10-CM

## 2011-08-20 DIAGNOSIS — D582 Other hemoglobinopathies: Secondary | ICD-10-CM

## 2011-08-20 MED ORDER — ATORVASTATIN CALCIUM 40 MG PO TABS: 40.0000 mg | ORAL_TABLET | Freq: Every day | ORAL | Status: DC

## 2011-08-20 MED ORDER — METFORMIN HCL ER 500 MG PO TB24: 1000.0000 mg | ORAL_TABLET | Freq: Two times a day (BID) | ORAL | Status: DC

## 2011-08-20 MED ORDER — GLIMEPIRIDE 4 MG PO TABS: 4.0000 mg | ORAL_TABLET | Freq: Every day | ORAL | Status: DC

## 2011-08-20 NOTE — Patient Instructions (Signed)
Today we updated your med list in our EPIC system...    Continue your current medications the same...  We decided to increase your METFORMIN to 1000mg  twice daily (2 of the 500mg  tabs twice a day at breakfast & dinner)...     And to add in the GLIMEPIRIDE 4mg  one tab at breakfast...  Keep up the good work w/ diet & exercise!!!  Jerome Waller, you must quit the smoking, and increase your water intake...  Call for any questions or if your blood sugar checks are not improving at home...  Let's plan a follow up visit in 3 months w/ repeat labs at that time to check your progress.Marland KitchenMarland Kitchen

## 2011-09-03 ENCOUNTER — Ambulatory Visit: Payer: BC Managed Care – PPO | Admitting: Endocrinology

## 2011-09-05 ENCOUNTER — Encounter: Payer: Self-pay | Admitting: Pulmonary Disease

## 2011-09-05 DIAGNOSIS — M545 Low back pain, unspecified: Secondary | ICD-10-CM | POA: Insufficient documentation

## 2011-09-05 DIAGNOSIS — D582 Other hemoglobinopathies: Secondary | ICD-10-CM | POA: Insufficient documentation

## 2011-09-05 NOTE — Progress Notes (Signed)
Subjective:    Patient ID: Jerome Waller, male    DOB: [birth year], 46 y.o.   MRN: [id number]  HPI 46 y/o WM here for a follow up visit... he has multiple medical problems as noted below...    ~   [name]:  feeling bad ever since Thanksgiving when he "got the crud"-  c/o fatigue, sore throat, sl cough w/ beige phlegm, abd discomfort, no n/v/d... he denies f/c/s but had decreased appetite, increased thirst, dry mouth and polyuria... he has noted a 25# weight loss... his mother-in-law is diabetic and checked his BS which was HHH- he put himself on a diet and rechecked 294... he waited til after the holidays to call us... BS= 386, A1c= 14.6.Marland Kitchen. started on Metformin 500Bid + diet etc... ~  Feb10:  he states much improved over the last month- he called [place] after 2 weeks and Metformin incr to 1000mg Bid & Glimepiride 4mg /d added... FBS down to 127-159 range and 2H pc = 99-192... weight stable at 183#... feeling better overall... ~  May10:  doing well for the last several months... weight stabilize in the 180-185 range, and BS's range 80 to 140 but he notes ?hypoglycemic at 9-10AM at work (can't check BS but symptoms compatible & feels better after eating... labs today w/ BS= 109 & A1c= 5.5, so we will decr the Gliep4 to 1/2 Qam... ~  Aug10:  continues to do well w/ diet/ exercise/ meds- BS at home 90-120 w/ 100 ave and labs here 8/27 showed FBS= 100, A1c= 5.4.Marland KitchenMarland Kitchen we discussed stopping the Glimepiride and continuing the Metformin 1000mg  Bid...  ~  ZHY86:  he has stayed on diet + exercise and weaned off the Metformin over the last 70mo (stopped 10/10)- now on diet alone... BS remains 100-120 & feeling well>> labs show FBS= 114, A1c=6.3 & he is encouraged to continue diet + exercise... he remains on Simva80 & recent FLP fair w/ LDL 122 (we discussed poss change to generic Lipitor later this yr)... he still smokes ~1/4 ppd & encouraged to quit... ~  Aug11:  he's had a good 70mo w/o new complaints or concerns... BS at  home all 100-140 range, and his A1c is 6.2 on diet alone... feeling well w/o CP, palpit/ SOB, etc... he needs to quit all smoking & incr exercise program, add ASA 81mg /d... we decided to switch his Simva80 to Lipitor40 at this time...  ~  July 31, 2010:  he states he had stopped checking his BS at home (prev all 100-120), feeling well, wt stable, still on diet (sort of)> then several weeks ago he checked his BS>300 x several days so he started taking his Metformin 1000mg  Bid again & BS improved to 150-180 range- no intercurrent infection or other obvious reason for the exac... labs ret w/ BS=168, A1c=9.2 & we discussed restarting METFORMIN 500mg  Bid along w/ strict DM diet, incr water intake etc...  still smoking & NEEDS TO QUIT... still taking the Simva80 w/ LDL 139 & NEEDS TO START LIP40.  ~  November 13, 2010:  3.15mo ROV & back on diet etc w/ improvement in accuchecks on his Metformin 500mg Bid, he felt that his sugars were too low so he cut back to 1/2 tab Bid & feels better on this dose w/ BS 90-105 he says; todays labs reveal BS=138, A1c=6.8, & OK to continue same dose + diet & exercise; he tells me that his family (father & sister) is pressuring him to see a diabetic specialist since "they really  know how to pin point these things" (what?) & we will set up appt at his request...  Lipids look good on Lip40> see labs below; advised to continue diet & exercise program; advised to QUIT SMOKING; advised DM eye exam etc...  ~  August 20, 2011:  59mo ROV & he has been under the care of DrEllison- currently on Metform500Bid & he stopped the Januvia100 (?why);  He had one interval lab in [facility]- A1c done 9/12 was 7.2;  Unfortunately current labs are once again horrible w/ FBS=261, A1c=11.0 and he doesn't have a clue what happened; he thinks elev BS is related to OTC "cold" meds he is currently taking; weight is 177# which is down 10# from our last visit...  Chol is fair on Lip40...  Note too that hg is elev at 31 & he  still smokes ==> we reviewed all these issues w/ the following plan:      Smoking> may partly be the etiology of his elev blood count; must quit all smoking, incr fluid intake & we will f/u CBC, if Hg still elev then we will refer to Heme for further check (check EPO level & JAK genetic marker)...    Chol> on Lip40 but the LDL is still not at goal; needs better diet, same med for now...    DM> maximize the Metform500-2Bid, add Glimep4mg Qam, get on diet & incr fluids; we discussed next step on Gliptin/ Thiazolid pills vs Insulin Rx...    LBP> he saw DrGioffre 7/12 w/ LBP into his left leg; XRays were neg; MRI was to be done by Ortho but we don't have f/u note & there is nothing in [facility]; pt notes that LBP is better w/ OTC meds... LABS 3/13:  FLP- at goals x LDL=109;  Chems- wnl x BS=261 A1c=11.0;  CBC- Hg=18.0;  Umicroalb- neg...   Problem List:  ALLERGIC RHINITIS (ICD-477.9) - mild symptoms... controlled w/ OTC Claritin or Zyrtek...  CIGARETTE SMOKER (ICD-305.1) - still smokes 1/4-1/2 ppd despite all efforts to get him to quit... he tried and failed Chantix in the past...  ~  1/10:  he tells me that he quit smoking 2 days ago, did it on his own!!! ~  5/10:  he tells me now that he's smoking again, < 1/2 ppd... encouraged to quit!!! ~  8/10:  back to 1/2 ppd and he feels this may be the best he can do. ~  2/11-2/12:  down to 1/4 ppd now but can't seem to do any better; offered smoking cessation counselling etc. ~  3/13:  Still smoking & must quit completely esp in light of his elev Hg=18.0  HYPERLIPIDEMIA (ICD-272.4) - hx Chol >300 in the past... doing fair on diet + SIMVASTATIN 80mg > we rec change to LIPITOR 40mg /d but he hasn't filled the new Rx yet... ~  FLP 12/07 on Lip40 showed TChol 176, TG 101, HDL 44, LDL 111 ~  FLP 1/09 on Lip40 showed TChol 209, TG 148, HDL 46, LDL 131... rec- change to Simva80. ~  FLP 1/10 on Simva80 showed TChol 319, TG 726, HDL 53, LDL 106... rec- new Rx for DM. ~   FLP 5/10 on Simva80 showed TChol 192, TG 83, HDL 48, LDL 128... same med, better diet. ~  FLP 2/11 on Simva80 showed TChol 186, TG 75, HDL 49, LDL 122 ~  FLP 8/11 on Simva80 showed TChol 208, TG 111, HDL 43, LDL 144... rec ch to Lipitor40 (he never did). ~  FLP 2/12 on  Simva80 showed TChol 204, TG 120, HDL 45, LDL 139... rec ch to Lip40 now. ~  FLP 6/12 on Lip40 showed TChol 152, TG 126, HDL 45, LDL 82... Continue Lip40 + diet & exerc. ~  FLP 3/13 on Lip40 showed TChol 178, TG 89, HDL 51, LDL 109  DIABETES MELLITUS (ICD-250.00) - new Dx [name]> started on Metformin & Glimepiride;  he did very well w/ diet + exercise & weaned off all meds 10/10 & did very well until 2/12 when BS inexplicably went back up >300 & A1c=9.2- rec to restart Metformin. ~  labs 1/10 showed BS= 386, HgA1c= 14.6.Marland Kitchen. rec> started Metformin1000Bid + diet/ exerc. ~  labs 2/10 showed BS= 107, HgA1c= 10.7.Marland Kitchen. rec> add Glimep4mg /d. ~  labs 5/10 showed BS= 107, HgA1c= 5.5.Marland Kitchen. rec> decr Glimep4 to 1/2 tabQam. ~  labs 8/10 showed BS= 100, A1c= 5.4.Marland Kitchen. rec> stop the Glimep, & taper Metform. ~  labs 2/11 showed BS= 114, A1c= 6.3.Marland KitchenMarland Kitchen continue diet + exercise. ~  labs 8/11 showed BS= 100, A1c= 6.2 ~  labs 2/12 (back on Metform) showed BS= 168, A1c= 9.2.Marland Kitchen. rec> start Metform500Bid +diet, +water. ~  Labs 6/12 on Metform250Bid showed BS= 138, A1c= 6.8.Marland KitchenMarland Kitchen OK to continue same dose + diet/ exerc. ~  6/12:  Endocrine consult w/ DrEllison- his notes are reviewed & pt switched to MetformER500Bid. ~  9/12: A1c= 7.2 & Actos 45mg /d added (?he never filled it) & ?insurance changed to Januvia 100mg /d but he didn't stick w/ it. ~  Labs 3/13 on Metform500Bid showed BS=261, A1c=11.0; REC> incr Metform500-2Bid, add Glimep4mg /d...  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  WRIST PAIN (ICD-719.43) - eval by DrKuzma w/ MRI showing scapholunate ligament disruption... Rx  splint, Mobic, consideration of surgery.  Hx of HEADACHE (ICD-784.0)  ELEVATED HEMOGLOBIN >> r/o polycythemia... ~  Labs 1/10 showed Hg= 17.1 ~  Labs 2/11 showed Hg= 17.5 ~  Labs in 2012 showed Hg= 17.4 to 18.0 ~  Labs 3/13 showed Hg= 18.0, other parameters are wnl...  Health Maintenance:  rec to take ASA 81mg /d... ~  GI:  Age 82- no hx GI problems, hematochezia, etc... ~  GU:  He is established w/ DrOttelin due to condyloma... ~  Immunizations:  he is rec to get the yearly Flu vaccines...    Past Surgical History  Procedure Date  . Mandible surgery 1988    for underbite    Outpatient Encounter Prescriptions as of 11/13/2010  Medication Sig Dispense Refill  . aspirin 81 MG tablet Take 81 mg by mouth daily.        Marland Kitchen atorvastatin (LIPITOR) 40 MG tablet Take 40 mg by mouth daily.        Marland Kitchen glucose blood (BAYER CONTOUR TEST) test strip Use as instructed to check blood sugar       . guaiFENesin (MUCINEX) 600 MG 12 hr tablet Take 1,200 mg by mouth 2 (two) times daily.        . metFORMIN (GLUCOPHAGE) 500 MG tablet Take 500 mg by mouth 2 (two) times daily with a meal.    ==> pt cut back to 1/2 Bid on his own...    . Multiple Vitamin (MULTIVITAMIN) capsule Take 1 capsule by mouth daily.        . Omega-3 Fatty Acids (FISH OIL) 1200 MG CAPS Take 1 capsule by mouth daily.          Allergies ==> No  Known Drug Allergies...   Current Medications, Allergies, Past Medical History, Past Surgical History, Family History, and Social History were reviewed in [employer] record.    Review of Systems        See HPI - all other systems neg except as noted... The patient denies anorexia, fever, weight loss, weight gain, vision loss, decreased hearing, hoarseness, chest pain, syncope, dyspnea on exertion, peripheral edema, prolonged cough, headaches, hemoptysis, abdominal pain, melena, hematochezia, severe indigestion/heartburn, hematuria, incontinence, muscle weakness, suspicious  skin lesions, transient blindness, difficulty walking, depression, unusual weight change, abnormal bleeding, enlarged lymph nodes, and angioedema.    Objective:   Physical Exam      WD, WN, 46 y/o WM in NAD... GENERAL:  Alert & oriented; pleasant & cooperative... HEENT:  [place]/AT, EOM-wnl, PERRLA, EACs-clear, TMs-wnl, NOSE-clear, THROAT-clear & wnl. NECK:  Supple w/ full ROM; no JVD; normal carotid impulses w/o bruits; no thyromegaly or nodules palpated; no lymphadenopathy. CHEST:  Clear to P & A; without wheezes/ rales/ or rhonchi. HEART:  Regular Rhythm; without murmurs/ rubs/ or gallops. ABDOMEN:  Soft & nontender; normal bowel sounds; no organomegaly or masses detected. EXT: without deformities, mild arthritic change in R wrist; no varicose veins/ venous insuffic/ or edema. NEURO:  CN's intact, no focal neuro deficits... SKIN:  WNL, no lesions seen...  RADIOLOGY DATA:  Reviewed in the EPIC EMR & discussed w/ the patient...  LABORATORY DATA:  Reviewed in the EPIC EMR & discussed w/ the patient...   Assessment & Plan:   SMOKER>  We discussed smoking cessation strategies but he is not motivated to quit...  HYPERLIPIDEMIA>  FLP much improved on Lip40 + his diet/ exercise/ etc; but LDL not at goal- needs better diet, exercise, etc...  DM>  DrEllison tried him on Actos (insurance wouldn't fill) and Januvia (he didn't stick w/ it); labs currently are horrible & we reviewed the avail treatments and their costs/ benefits/ side effects/ etc... REC> Metform500-2Bid, Glimep4mg  Qam + diet etc; ROV 39mo...  LBP>  He was eval by [facility] 7/12...  Elev Hemoglobin>  Hg ~18 & may need Heme consult (consider EPO level & JAK mutation genetics); for now QUIT SMOKING, incr fluids...  Other medical issues as noted.Marland KitchenMarland Kitchen

## 2011-10-15 ENCOUNTER — Telehealth: Payer: Self-pay | Admitting: Pulmonary Disease

## 2011-10-15 MED ORDER — ROSUVASTATIN CALCIUM 20 MG PO TABS: 20.0000 mg | ORAL_TABLET | Freq: Every day | ORAL | Status: DC

## 2011-10-15 NOTE — Telephone Encounter (Signed)
Per SN---simvastatin (zocor) did not work---rec to change to crestor 20mg     #30  1 po daily and refill prn.  thanks

## 2011-10-15 NOTE — Telephone Encounter (Signed)
Pt's wife returned call.  Reports pt was changed from zocor to lipitor because insurance would cover the lipitor.  States pt's blood sugar started to increase after switching to the Lipitor.  She has read this could be a side effect from Lipitor and requesting pt to go back on zocor.  Reports his blood sugar has been running 230-300.  Last night he took one of her zocor and blood sugar this am 134.  Dr. Kriste Basque, pls advise.  Thank you.  [facility]

## 2011-10-15 NOTE — Telephone Encounter (Signed)
Per last ov note by SN: ~ July 31, 2010: he states he had stopped checking his BS at home (prev all 100-120), feeling well, wt stable, still on diet (sort of)> then several weeks ago he checked his BS>300 x several days so he started taking his Metformin 1000mg  Bid again & BS improved to 150-180 range- no intercurrent infection or other obvious reason for the exac... labs ret w/ BS=168, A1c=9.2 & we discussed restarting METFORMIN 500mg  Bid along w/ strict DM diet, incr water intake etc... still smoking & NEEDS TO QUIT... still taking the Simva80 w/ LDL 139 & NEEDS TO START LIP40.   LM w/ family member to have patient call back.

## 2011-10-15 NOTE — Telephone Encounter (Signed)
Called, spoke with pt's wife.  I informed her of below per Dr. Kriste Basque.  She verbalized understanding of this and aware crestor rx sent to [facility].  She will call back if anything further is needed.

## 2011-11-19 ENCOUNTER — Other Ambulatory Visit (INDEPENDENT_AMBULATORY_CARE_PROVIDER_SITE_OTHER): Payer: BC Managed Care – PPO

## 2011-11-19 ENCOUNTER — Telehealth: Payer: Self-pay | Admitting: Pulmonary Disease

## 2011-11-19 ENCOUNTER — Other Ambulatory Visit: Payer: Self-pay | Admitting: Pulmonary Disease

## 2011-11-19 DIAGNOSIS — A630 Anogenital (venereal) warts: Secondary | ICD-10-CM

## 2011-11-19 DIAGNOSIS — E119 Type 2 diabetes mellitus without complications: Secondary | ICD-10-CM

## 2011-11-19 LAB — HEMOGLOBIN A1C: Hgb A1c MFr Bld: 6.8 % — ABNORMAL HIGH (ref 4.6–6.5)

## 2011-11-19 LAB — CBC WITH DIFFERENTIAL/PLATELET
Basophils Absolute: 0 10*3/uL (ref 0.0–0.1)
Basophils Relative: 0.3 % (ref 0.0–3.0)
Eosinophils Absolute: 0.3 10*3/uL (ref 0.0–0.7)
Lymphocytes Relative: 36.6 % (ref 12.0–46.0)
MCHC: 34.2 g/dL (ref 30.0–36.0)
Monocytes Relative: 6.5 % (ref 3.0–12.0)
Neutrophils Relative %: 53.1 % (ref 43.0–77.0)
RBC: 5.55 Mil/uL (ref 4.22–5.81)
RDW: 12.7 % (ref 11.5–14.6)

## 2011-11-19 LAB — BASIC METABOLIC PANEL
BUN: 15 mg/dL (ref 6–23)
GFR: 84.35 mL/min (ref >60.00)
Potassium: 5.4 mEq/L — ABNORMAL HIGH (ref 3.5–5.1)
Sodium: 140 mEq/L (ref 135–145)

## 2011-11-19 NOTE — Telephone Encounter (Signed)
Labs are in the computer for the pt thanks 

## 2011-11-19 NOTE — Telephone Encounter (Signed)
Pt coming in next Friday 6-14 and wants to have labs done today. I see that he does need labs for this next appt, but unsure what to order. Please advise, thanks!

## 2011-11-19 NOTE — Telephone Encounter (Signed)
Javier Docker (wife) [phone] called back Leanora Ivanoff

## 2011-11-19 NOTE — Telephone Encounter (Signed)
Spoke with pt and notified that the lab orders are in the computer and can come by and have this done.

## 2011-11-26 ENCOUNTER — Ambulatory Visit (INDEPENDENT_AMBULATORY_CARE_PROVIDER_SITE_OTHER): Payer: BC Managed Care – PPO | Admitting: Pulmonary Disease

## 2011-11-26 ENCOUNTER — Encounter: Payer: Self-pay | Admitting: Pulmonary Disease

## 2011-11-26 VITALS — BP 128/88 | HR 84 | Temp 97.0°F | Ht 72.0 in | Wt 181.2 lb

## 2011-11-26 DIAGNOSIS — E119 Type 2 diabetes mellitus without complications: Secondary | ICD-10-CM

## 2011-11-26 DIAGNOSIS — E785 Hyperlipidemia, unspecified: Secondary | ICD-10-CM

## 2011-11-26 DIAGNOSIS — F17200 Nicotine dependence, unspecified, uncomplicated: Secondary | ICD-10-CM

## 2011-11-26 DIAGNOSIS — D582 Other hemoglobinopathies: Secondary | ICD-10-CM

## 2011-11-26 NOTE — Patient Instructions (Addendum)
Today we updated your med list in our EPIC system...    We discussed checking your blood sugar regularly & restarting your Metformin if your sugars are >150 range...    OK to leave off the glimepiride for now...  We will arrange for a Hematology consultation regarding your elevated red blood cell count...  In the meanwhile try to cut out the last of the smoking...  Call for any questions...  Let's plan another follow up visit in 3-4 months w/ FASTING blood work prior.Marland KitchenMarland Kitchen

## 2011-11-27 ENCOUNTER — Encounter: Payer: Self-pay | Admitting: Pulmonary Disease

## 2011-11-27 NOTE — Progress Notes (Signed)
Subjective:    Patient ID: Jerome Waller, male    DOB: 1965-07-07, 46 y.o.   MRN: [id number]  HPI 46 y/o WM here for a follow up visit... he has multiple medical problems as noted below...    ~   [name]:  feeling bad ever since Thanksgiving when he "got the crud"-  c/o fatigue, sore throat, sl cough w/ beige phlegm, abd discomfort, no n/v/d... he denies f/c/s but had decreased appetite, increased thirst, dry mouth and polyuria... he has noted a 25# weight loss... his mother-in-law is diabetic and checked his BS which was HHH- he put himself on a diet and rechecked 294... he waited til after the holidays to call us... BS= 386, A1c= 14.6.Marland Kitchen. started on Metformin 500Bid + diet etc... ~  Feb10:  he states much improved over the last month- he called [place] after 2 weeks and Metformin incr to 1000mg Bid & Glimepiride 4mg /d added... FBS down to 127-159 range and 2H pc = 99-192... weight stable at 183#... feeling better overall... ~  May10:  doing well for the last several months... weight stabilize in the 180-185 range, and BS's range 80 to 140 but he notes ?hypoglycemic at 9-10AM at work (can't check BS but symptoms compatible & feels better after eating... labs today w/ BS= 109 & A1c= 5.5, so we will decr the Gliep4 to 1/2 Qam... ~  Aug10:  continues to do well w/ diet/ exercise/ meds- BS at home 90-120 w/ 100 ave and labs here 8/27 showed FBS= 100, A1c= 5.4.Marland KitchenMarland Kitchen we discussed stopping the Glimepiride and continuing the Metformin 1000mg  Bid... ~  [name]:  he has stayed on diet + exercise and weaned off the Metformin over the last 52mo (stopped 10/10)- now on diet alone... BS remains 100-120 & feeling well>> labs show FBS= 114, A1c=6.3 & he is encouraged to continue diet + exercise... he remains on Simva80 & recent FLP fair w/ LDL 122 (we discussed poss change to generic Lipitor later this yr)... he still smokes ~1/4 ppd & encouraged to quit... ~  Aug11:  he's had a good 52mo w/o new complaints or concerns... BS at home  all 100-140 range, and his A1c is 6.2 on diet alone... feeling well w/o CP, palpit/ SOB, etc... he needs to quit all smoking & incr exercise program, add ASA 81mg /d... we decided to switch his Simva80 to Lipitor40 at this time...  ~  July 31, 2010:  he states he had stopped checking his BS at home (prev all 100-120), feeling well, wt stable, still on diet (sort of)> then several weeks ago he checked his BS>300 x several days so he started taking his Metformin 1000mg  Bid again & BS improved to 150-180 range- no intercurrent infection or other obvious reason for the exac... labs ret w/ BS=168, A1c=9.2 & we discussed restarting METFORMIN 500mg  Bid along w/ strict DM diet, incr water intake etc...  still smoking & NEEDS TO QUIT... still taking the Simva80 w/ LDL 139 & NEEDS TO START LIP40.  ~  November 13, 2010:  3.21mo ROV & back on diet etc w/ improvement in accuchecks on his Metformin 500mg Bid, he felt that his sugars were too low so he cut back to 1/2 tab Bid & feels better on this dose w/ BS 90-105 he says; todays labs reveal BS=138, A1c=6.8, & OK to continue same dose + diet & exercise; he tells me that his family (father & sister) is pressuring him to see a diabetic specialist since "they really know  how to pin point these things" (what?) & we will set up appt at his request...  Lipids look good on Lip40> see labs below; advised to continue diet & exercise program; advised to QUIT SMOKING; advised DM eye exam etc...  ~  August 20, 2011:  76mo ROV & he has been under the care of DrEllison- currently on Metform500Bid & he stopped the Januvia100 (?why);  He had one interval lab in [facility]- A1c done 9/12 was 7.2;  Unfortunately current labs are once again horrible w/ FBS=261, A1c=11.0 and he doesn't have a clue what happened; he thinks elev BS is related to OTC "cold" meds he is currently taking; weight is 177# which is down 10# from our last visit...  Chol is fair on Lip40...  Note too that hg is elev at 63 & he still  smokes ==> we reviewed all these issues w/ the following plan:      Smoking> may partly be the etiology of his elev blood count; must quit all smoking, incr fluid intake & we will f/u CBC, if Hg still elev then we will refer to Heme for further check (check EPO level & JAK genetic marker)...    Chol> on Lip40 but the LDL is still not at goal; needs better diet, same med for now...    DM> maximize the Metform500-2Bid, add Glimep4mg Qam, get on diet & incr fluids; we discussed next step on Gliptin/ Thiazolid pills vs Insulin Rx...    LBP> he saw DrGioffre 7/12 w/ LBP into his left leg; XRays were neg; MRI was to be done by Ortho but we don't have f/u note & there is nothing in [facility]; pt notes that LBP is better w/ OTC meds... LABS 3/13:  FLP- at goals x LDL=109;  Chems- wnl x BS=261 A1c=11.0;  CBC- Hg=18.0;  Umicroalb- neg...  ~  November 26, 2011:  7mo ROV & [name] has once again stopped all his meds on his own> he says they figured out that his worsening BS occurred when he switched to Lipitor40 & he blamed that med for the A1c=11.0 at last OV;  He called & we changed him to Crestor 20mg /d;  He stopped his Metformin & Glimepiride on his own & says that his BS has been 120-200 at home==> we discussed all this & suggested to him that to prevent the kind of trouble he had prev (A1c 11-14) that he check his BS often & plan to take Metformin 500mg  1-2 per day if his BS is >150 range (he will try to comply he says)...     He still smokes ~1/2ppd and CBC is worse w/ Hg= 18.3, so at this point I have requested consultation from Hematology to r/o PVera etc (will leave it to Heme to check his EPO level & JAK genetic marker)... LABS 6/13:  BMet- ok x BS=125 A1c=6.8;  CBC- Hg=18.3 & we will refer to HEME...   Problem List:  ALLERGIC RHINITIS (ICD-477.9) - mild symptoms... controlled w/ OTC Claritin or Zyrtek...  CIGARETTE SMOKER (ICD-305.1) - still smokes 1/4-1/2 ppd despite all efforts to get him to quit... he tried  and failed Chantix in the past...  ~  CXR 1/09 showed heart normal size, clear lungs, NAD... ~  1/10:  he tells me that he quit smoking 2 days ago, did it on his own!!! ~  5/10:  he tells me now that he's smoking again, < 1/2 ppd... encouraged to quit!!! ~  8/10:  back to 1/2 ppd  and he feels this may be the best he can do. ~  2/11-2/12:  down to 1/4 ppd now but can't seem to do any better; offered smoking cessation counselling etc. ~  3/13-6/13:  Still smoking & must quit completely esp in light of his elev Hg=18.0  HYPERLIPIDEMIA (ICD-272.4) - hx Chol >300 in the past...  ~  FLP 12/07 on Lip40 showed TChol 176, TG 101, HDL 44, LDL 111 ~  FLP 1/09 on Lip40 showed TChol 209, TG 148, HDL 46, LDL 131... rec- change to Simva80. ~  FLP 1/10 ?on Simva80? showed TChol 319, TG 726, HDL 53, LDL 105... rec- new Rx for DM. ~  FLP 5/10 on Simva80 showed TChol 192, TG 83, HDL 48, LDL 128... same med, better diet. ~  FLP 2/11 on Simva80 showed TChol 186, TG 75, HDL 49, LDL 122 ~  FLP 8/11 on Simva80 showed TChol 208, TG 111, HDL 43, LDL 144... rec ch to Lipitor40 (he never did). ~  FLP 2/12 on Simva80 showed TChol 204, TG 120, HDL 45, LDL 139... rec ch to Lip40 now. ~  FLP 6/12 on Lip40 showed TChol 152, TG 126, HDL 45, LDL 82... Continue Lip40 + diet & exerc. ~  FLP 3/13 on Lip40 showed TChol 178, TG 89, HDL 51, LDL 109 ~  4/13:  Pt decided that the Lipitor caused his DM & he wants off this med; switched to CRESTOR 20mg /d...  DIABETES MELLITUS (ICD-250.00) - new Dx [name]> started on Metformin & Glimepiride;  His treatment has been complicated by lack of pt compliance & freq med changes on his own...  ~  labs 1/10 showed BS= 386, HgA1c= 14.6.Marland Kitchen. rec> started Metformin1000Bid + diet/ exerc. ~  labs 2/10 showed BS= 107, HgA1c= 10.7.Marland Kitchen. rec> add Glimep4mg /d. ~  labs 5/10 showed BS= 107, HgA1c= 5.5.Marland Kitchen. rec> decr Glimep4 to 1/2 tabQam. ~  labs 8/10 showed BS= 100, A1c= 5.4.Marland Kitchen. rec> stop the Glimep, & taper  Metform. ~  labs 2/11 showed BS= 114, A1c= 6.3.Marland KitchenMarland Kitchen continue diet + exercise. ~  labs 8/11 showed BS= 100, A1c= 6.2 ~  labs 2/12 (back on Metform) showed BS= 168, A1c= 9.2.Marland Kitchen. rec> start Metform500Bid +diet, +water. ~  Labs 6/12 on Metform250Bid showed BS= 138, A1c= 6.8.Marland KitchenMarland Kitchen OK to continue same dose + diet/ exerc. ~  6/12:  Endocrine consult w/ DrEllison- his notes are reviewed & pt switched to MetformER500Bid. ~  9/12: A1c= 7.2 & Actos 45mg /d added (?he never filled it) & ?insurance changed to Januvia 100mg /d but he didn't stick w/ it. ~  Labs 3/13 on Metform500Bid showed BS=261, A1c=11.0; REC> incr Metform500-2Bid, add Glimep4mg /d... ~  6/13:  Pt decided it was the Lipitor that caused his DM; stopped Lip40 & changed to Cres20; he stopped his Metform&Glimep and follow up labs showed BS= 125, A1c= 6.8==> we discussed checking BS freq at home & taking Metformin 500mg  if BS>150, he likes this idea...   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  LBP >> he went to [facility], DrGioffre, for LBP & radiation to left leg; XRays were reported normal; MRI was ordered but we do not have the reusults & did not receive a follow up note...   WRIST PAIN (ICD-719.43) - eval by DrKuzma w/ MRI showing scapholunate ligament disruption... Rx splint, Mobic, consideration of surgery.  Hx of HEADACHE (ICD-784.0)  ELEVATED HEMOGLOBIN >> r/o polycythemia... ~  Labs 1/10  showed Hg= 17.1 ~  Labs 2/11 showed Hg= 17.5 ~  Labs in 2012 showed Hg= 17.4 to 18.0 ~  Labs 3/13 showed Hg= 18.0, other parameters are wnl... ~  Labs 6/13 showed Hg= 18.3 and we decided to refer pt to HEMATOLOGY for further eval.  Health Maintenance:  rec to take ASA 81mg /d... ~  GI:  Age 86- no hx GI problems, hematochezia, etc... ~  GU:  He is established w/ DrOttelin due to condyloma... ~  Immunizations:  he is rec to get the yearly Flu vaccines...     Past Surgical History  Procedure Date  . Mandible surgery 1988    for underbite    Outpatient Encounter Prescriptions as of 11/26/2011  Medication Sig Dispense Refill  . aspirin 81 MG tablet Take 81 mg by mouth daily.        Marland Kitchen glucose blood (BAYER CONTOUR TEST) test strip Use as instructed to check blood sugar       . metFORMIN (GLUCOPHAGE-XR) 500 MG 24 hr tablet Take as directed      . Multiple Vitamin (MULTIVITAMIN) capsule Take 1 capsule by mouth daily.        . Omega-3 Fatty Acids (FISH OIL) 1200 MG CAPS Take 1 capsule by mouth daily.        . rosuvastatin (CRESTOR) 20 MG tablet Take 1 tablet (20 mg total) by mouth daily.  30 tablet  11  . DISCONTD: metFORMIN (GLUCOPHAGE-XR) 500 MG 24 hr tablet Take 2 tablets (1,000 mg total) by mouth 2 (two) times daily.  120 tablet  11  . DISCONTD: glimepiride (AMARYL) 4 MG tablet Take 1 tablet (4 mg total) by mouth daily before breakfast.  30 tablet  11    Allergies ==> No Known Drug Allergies...   Current Medications, Allergies, Past Medical History, Past Surgical History, Family History, and Social History were reviewed in [employer] record.    Review of Systems        See HPI - all other systems neg except as noted... The patient denies anorexia, fever, weight loss, weight gain, vision loss, decreased hearing, hoarseness, chest pain, syncope, dyspnea on exertion, peripheral edema, prolonged cough, headaches, hemoptysis, abdominal pain, melena, hematochezia, severe indigestion/heartburn, hematuria, incontinence, muscle weakness, suspicious skin lesions, transient blindness, difficulty walking, depression, unusual weight change, abnormal bleeding, enlarged lymph nodes, and angioedema.    Objective:   Physical Exam      WD, WN, 47 y/o WM in NAD... GENERAL:  Alert & oriented; pleasant & cooperative... HEENT:  [place]/AT, EOM-wnl, PERRLA, EACs-clear, TMs-wnl, NOSE-clear, THROAT-clear & wnl. NECK:  Supple w/ full ROM; no  JVD; normal carotid impulses w/o bruits; no thyromegaly or nodules palpated; no lymphadenopathy. CHEST:  Clear to P & A; without wheezes/ rales/ or rhonchi. HEART:  Regular Rhythm; without murmurs/ rubs/ or gallops. ABDOMEN:  Soft & nontender; normal bowel sounds; no organomegaly or masses detected. EXT: without deformities, mild arthritic change in R wrist; no varicose veins/ venous insuffic/ or edema. NEURO:  CN's intact, no focal neuro deficits... SKIN:  WNL, no lesions seen...  RADIOLOGY DATA:  Reviewed in the EPIC EMR & discussed w/ the patient...  LABORATORY DATA:  Reviewed in the EPIC EMR & discussed w/ the patient...   Assessment & Plan:   SMOKER>  We discussed smoking cessation strategies but he is not motivated to quit...  HYPERLIPIDEMIA>  FLP much improved on Lip40 + his diet/ exercise/ etc; but he decided (2013) that the Lipitor caused  his DM & refused this med; switched to Cres20.   DM>  DrEllison tried him on Actos (insurance wouldn't fill) and Januvia (he didn't stick w/ it); labs ret w/ A1c=11 & we reviewed the avail treatments and their costs/ benefits/ side effects/ etc... Started on Metform500-2Bid, Glimep4mg  Qam + diet etc;  He stopped these on his own & states his BS at home 120-200 range;  He is rec to check BS regularly & take 500mg  Metformin id BS>150 range... Ndrew definitely has his own ideas about things & not interested in my opinion...  LBP>  He was eval by [facility] 7/12...  Elev Hemoglobin>  Hg now 18.3 & we will refer for Heme consult (consider EPO level & JAK mutation genetics); for now QUIT SMOKING, incr fluids...  Other medical issues as noted...   Patient's Medications  New Prescriptions   No medications on file  Previous Medications   ASPIRIN 81 MG TABLET    Take 81 mg by mouth daily.     GLUCOSE BLOOD (BAYER CONTOUR TEST) TEST STRIP    Use as instructed to check blood sugar    MULTIPLE VITAMIN (MULTIVITAMIN) CAPSULE    Take 1 capsule by mouth  daily.     OMEGA-3 FATTY ACIDS (FISH OIL) 1200 MG CAPS    Take 1 capsule by mouth daily.     ROSUVASTATIN (CRESTOR) 20 MG TABLET    Take 1 tablet (20 mg total) by mouth daily.  Modified Medications   Modified Medication Previous Medication   METFORMIN (GLUCOPHAGE-XR) 500 MG 24 HR TABLET metFORMIN (GLUCOPHAGE-XR) 500 MG 24 hr tablet      Take as directed    Take 2 tablets (1,000 mg total) by mouth 2 (two) times daily.  Discontinued Medications   GLIMEPIRIDE (AMARYL) 4 MG TABLET    Take 1 tablet (4 mg total) by mouth daily before breakfast.

## 2011-12-03 ENCOUNTER — Telehealth: Payer: Self-pay | Admitting: *Deleted

## 2011-12-03 NOTE — Telephone Encounter (Signed)
Called to speak to patient regarding recent referral from Dr Kriste Basque for Polycythemia. No EPO levels or Jak2 have been collected at this time, per Dr Kriste Basque note, he will leave this to Hematologist. Patient was informed that his blood was too high and would be giving blood here with consultation. Called and instructed patient to be prepared for possible phlebotomy on same day as visit, so he should eat and hydrate well that day in the event that the consulting MD wants to start treatments same day. Patient verbalized understanding and will be contacted by scheduling to confirm appts.

## 2011-12-07 ENCOUNTER — Telehealth: Payer: Self-pay | Admitting: Oncology

## 2011-12-07 NOTE — Telephone Encounter (Signed)
Referred by Dr. Alroy Dust Dx- Polycythemia

## 2011-12-13 ENCOUNTER — Other Ambulatory Visit: Payer: Self-pay | Admitting: Oncology

## 2011-12-13 DIAGNOSIS — D582 Other hemoglobinopathies: Secondary | ICD-10-CM

## 2011-12-17 ENCOUNTER — Ambulatory Visit (HOSPITAL_BASED_OUTPATIENT_CLINIC_OR_DEPARTMENT_OTHER): Payer: BC Managed Care – PPO | Admitting: Oncology

## 2011-12-17 ENCOUNTER — Ambulatory Visit: Payer: BC Managed Care – PPO

## 2011-12-17 ENCOUNTER — Other Ambulatory Visit (HOSPITAL_BASED_OUTPATIENT_CLINIC_OR_DEPARTMENT_OTHER): Payer: BC Managed Care – PPO | Admitting: Lab

## 2011-12-17 ENCOUNTER — Other Ambulatory Visit: Payer: Self-pay | Admitting: Lab

## 2011-12-17 VITALS — BP 126/87 | HR 64 | Temp 97.5°F | Ht 72.0 in | Wt 178.6 lb

## 2011-12-17 DIAGNOSIS — D582 Other hemoglobinopathies: Secondary | ICD-10-CM

## 2011-12-17 DIAGNOSIS — D45 Polycythemia vera: Secondary | ICD-10-CM

## 2011-12-17 DIAGNOSIS — D751 Secondary polycythemia: Secondary | ICD-10-CM

## 2011-12-17 LAB — CBC WITH DIFFERENTIAL/PLATELET
Basophils Absolute: 0 10*3/uL (ref 0.0–0.1)
Eosinophils Absolute: 0.2 10*3/uL (ref 0.0–0.5)
HCT: 49.9 % (ref 38.4–49.9)
HGB: 17.7 g/dL — ABNORMAL HIGH (ref 13.0–17.1)
NEUT#: 3.2 10*3/uL (ref 1.5–6.5)
RDW: 12.5 % (ref 11.0–14.6)
lymph#: 2.5 10*3/uL (ref 0.9–3.3)

## 2011-12-17 NOTE — Progress Notes (Signed)
CC:   Jerome Waller. Kriste Basque, MD  REASON FOR CONSULTATION:  Polycythemia.  HISTORY OF PRESENT ILLNESS:  Jerome Waller is a 46 year old gentleman, native of [place], currently of [place], [place].  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  He also worked in [employer] in the past among other jobs.  He is a gentleman with a past medical history significant for hyperlipidemia and diabetes that has been under recently reasonable control.  He is on metformin, which he does not take regularly at this time.  He was noted to have an elevated hemoglobin dating back 3 years ago.  In January 2010, his hemoglobin was 17.1 and fluctuated up to 18 in 2012 and most recently was 18.3 on 11/19/2011. His white cells were normal at 7.6, platelet count was 206. Differential was perfectly normal at this time.  His chemistries also all within normal range.  His TSH was also normal at 0.92.  He had normal liver function tests, and last hemoglobin A1c from what I can see from March 2013 was 11.  The patient was referred for evaluation for his polycythemia.  He is asymptomatic from it at this time.  He had not had any headaches, had not had any blurry vision, had not had any chest pain.  Did not have any bleeding or clotting episodes.  Had not had any pruritus.  Overall, performing his activities of daily living without any hindrance or decline.  He is a smoker, but he has been trying to cut down significantly.  At his most, he smoked half a pack a day and currently smokes about 3 cigarettes a day.  He does report some chemical exposure to his job, although he cannot really quantify it for me.  He does have exposure to carbon monoxide at times, but he does wear a mask at work.  He denied any supplements at this time.  He has not taken any testosterone injections or supplementation or  any growth factor hormone. He had not had any higher elevation training, but he is working in a rather demanding job.  He is dehydrated at times.  He admits taking not enough hydration.  REVIEW OF SYSTEMS:  He does not report any headaches, blurry vision, double vision.  Does not report any motor or sensory neuropathy.  Does not report any alteration in mental status.  Does not report any psychiatric issues, depression.  Does not report any fever, chills, sweats.  Does not report any cough, hemoptysis, hematemesis.  No nausea or vomiting.  Does not report any abdominal pain.  No hematochezia, no melena.  No genitourinary complaints.  The rest of the review of systems is unremarkable.  PAST MEDICAL HISTORY:  Significant for hypertension, hyperlipidemia, history of diabetes but no history of heart disease, arthritis, liver disease or coronary artery disease.  MEDICATIONS:  He is currently on low-dose aspirin 81 mg.  He is on metformin, use as needed, multivitamin, omega-3, Crestor.  ALLERGIES:  None.  SOCIAL HISTORY:  He is married.  He does not have any children.  Denied any alcohol abuse.  He does smoke about 2-3 cigarettes a day.  FAMILY HISTORY:  His father is 4, has hyperlipidemia, no history of any blood disorders.  His mother is in reasonably good health.  PHYSICAL EXAMINATION:  General:  Alert, awake gentleman, appeared in no active distress.  Vital Signs:  His blood pressure is 126/87, pulse 64, respirations 20.  Weight is 178 pounds.  ECOG performance status is 0. HEENT:  Head is normocephalic, atraumatic.  Pupils equal, round and reactive to light.  Oral mucosa moist and pink.  Neck:  Supple, no lymphadenopathy.  Heart:  Regular rate and rhythm, S1 and S2.  Lungs: Clear to auscultation.  No rhonchi, wheezes or dullness to percussion. Abdomen:  Soft, nontender, no hepatosplenomegaly.  Extremities:  Had no clubbing, cyanosis, or edema.  Neurologic:  Intact motor, sensory  and deep tendon reflexes.  LABORATORY DATA:  Today showed a hemoglobin of 17.7, upper limit of normal 17.1, hematocrit is 49.9, white cell count 6.4, platelet count of 177.  ASSESSMENT AND PLAN:  This is a pleasant 46 year old gentleman with the following issues: 1. Erythrocytosis, possible polycythemia.  The differential diagnosis     was discussed today with Jerome Waller and his wife.  I have talked     to him about secondary causes at this time.  Causes that include     chronic hypoxic state such as sleep apnea, smoking, toxic fume     exposure, carbon monoxide exposure, and also relative     erythrocytosis could be also a factor due to mild dehydration.  He     is not on a diuretic, but he does admit to not drinking enough     fluids at times, and certainly his smoking does not help at this     time.  I doubt there is any element of higher elevation or blood     doping at this time.  He really denies using any testosterone or     any hormonal aid supplements.  An erythropoietin-producing tumor I     think is unlikely at this time.  I am checking an erythropoietin     level to evaluate that.  Primary causes such as polycythemia vera I     think are extremely unlikely.  His white cells and platelets are     normal.  He has had really more fluctuating down numbers, as     hemoglobin went down from 18.3 to 17.7, and it goes against it at     this time.  Certainly, JAK-2 mutation can help in determining that.     In terms of management standpoint, he is completely asymptomatic at     this point.  His hemoglobin is really close to the baseline normal     levels, and for that reason, I would not really recommend any     further treatment for his hemoglobin.  He is already on aspirin     thrombosis prophylaxis.  All his questions were answered at this     time. 2. Followup.  I have discussed with Mr. Wernick the need for     hematology followup.  Again, I feel this is a rather mild      erythrocytosis due to secondary causes.  I doubt this is     polycythemia vera.  For that reason, I will be happy to see Mr.     Bouch as needed, and I have given him my contact information, and     at any time it is felt that we need to see him or Dr. Kriste Basque feels     we need to see him, I will be happy to reevaluate him.    ______________________________ Benjiman Core, M.D. FNS/MEDQ  D:  12/17/2011  T:  12/17/2011  Job:  [id number] 

## 2011-12-17 NOTE — Progress Notes (Signed)
Note dictated

## 2011-12-20 LAB — COMPREHENSIVE METABOLIC PANEL
AST: 19 U/L (ref 0–37)
Albumin: 4.7 g/dL (ref 3.5–5.2)
BUN: 16 mg/dL (ref 6–23)
CO2: 28 mEq/L (ref 19–32)
Calcium: 10.1 mg/dL (ref 8.4–10.5)
Chloride: 101 mEq/L (ref 96–112)
Glucose, Bld: 149 mg/dL — ABNORMAL HIGH (ref 70–99)
Potassium: 5 mEq/L (ref 3.5–5.3)

## 2011-12-20 LAB — ERYTHROPOIETIN: Erythropoietin: 8.8 m[IU]/mL (ref 2.6–34.0)

## 2012-03-03 ENCOUNTER — Ambulatory Visit (INDEPENDENT_AMBULATORY_CARE_PROVIDER_SITE_OTHER): Payer: BC Managed Care – PPO | Admitting: Pulmonary Disease

## 2012-03-03 ENCOUNTER — Encounter: Payer: Self-pay | Admitting: Pulmonary Disease

## 2012-03-03 VITALS — BP 126/90 | HR 93 | Temp 97.1°F | Ht 72.0 in | Wt 182.6 lb

## 2012-03-03 DIAGNOSIS — E785 Hyperlipidemia, unspecified: Secondary | ICD-10-CM

## 2012-03-03 DIAGNOSIS — F17200 Nicotine dependence, unspecified, uncomplicated: Secondary | ICD-10-CM

## 2012-03-03 DIAGNOSIS — E119 Type 2 diabetes mellitus without complications: Secondary | ICD-10-CM

## 2012-03-03 DIAGNOSIS — D751 Secondary polycythemia: Secondary | ICD-10-CM | POA: Insufficient documentation

## 2012-03-03 NOTE — Patient Instructions (Addendum)
Today we updated your med list in our EPIC system...    Continue your current medications the same...  Continue to monitor your BS carefully at home & take the Metformin as discussed...  Keep up the good work w/ smoking cessation, work on quitting completely!!!  Please return to our lab nex week for your FASTING blood work...    We will call you w/ the results when avail...  Let's plan a follow up visit in 6 months.Marland KitchenMarland Kitchen

## 2012-03-04 ENCOUNTER — Encounter: Payer: Self-pay | Admitting: Pulmonary Disease

## 2012-03-04 NOTE — Progress Notes (Addendum)
Subjective:    Patient ID: Jerome Waller, male    DOB: 06-22-1965, 46 y.o.   MRN: [id number]  HPI 46 y/o WM here for a follow up visit... he has multiple medical problems as noted below...    ~   [name]:  feeling bad ever since Thanksgiving when he "got the crud"-  c/o fatigue, sore throat, sl cough w/ beige phlegm, abd discomfort, no n/v/d... he denies f/c/s but had decreased appetite, increased thirst, dry mouth and polyuria... he has noted a 25# weight loss... his mother-in-law is diabetic and checked his BS which was HHH- he put himself on a diet and rechecked 294... he waited til after the holidays to call us... BS= 386, A1c= 14.6.Marland Kitchen. started on Metformin 500Bid + diet etc... ~  Feb10:  he states much improved over the last month- he called [place] after 2 weeks and Metformin incr to 1000mg Bid & Glimepiride 4mg /d added... FBS down to 127-159 range and 2H pc = 99-192... weight stable at 183#... feeling better overall... ~  May10:  doing well for the last several months... weight stabilize in the 180-185 range, and BS's range 80 to 140 but he notes ?hypoglycemic at 9-10AM at work (can't check BS but symptoms compatible & feels better after eating... labs today w/ BS= 109 & A1c= 5.5, so we will decr the Gliep4 to 1/2 Qam... ~  Aug10:  continues to do well w/ diet/ exercise/ meds- BS at home 90-120 w/ 100 ave and labs here 8/27 showed FBS= 100, A1c= 5.4.Marland KitchenMarland Kitchen we discussed stopping the Glimepiride and continuing the Metformin 1000mg  Bid... ~  NWG95:  he has stayed on diet + exercise and weaned off the Metformin over the last 82mo (stopped 10/10)- now on diet alone... BS remains 100-120 & feeling well>> labs show FBS= 114, A1c=6.3 & he is encouraged to continue diet + exercise... he remains on Simva80 & recent FLP fair w/ LDL 122 (we discussed poss change to generic Lipitor later this yr)... he still smokes ~1/4 ppd & encouraged to quit... ~  Aug11:  he's had a good 82mo w/o new complaints or concerns... BS at home  all 100-140 range, and his A1c is 6.2 on diet alone... feeling well w/o CP, palpit/ SOB, etc... he needs to quit all smoking & incr exercise program, add ASA 81mg /d... we decided to switch his Simva80 to Lipitor40 at this time...  ~  July 31, 2010:  he states he had stopped checking his BS at home (prev all 100-120), feeling well, wt stable, still on diet (sort of)> then several weeks ago he checked his BS>300 x several days so he started taking his Metformin 1000mg  Bid again & BS improved to 150-180 range- no intercurrent infection or other obvious reason for the exac... labs ret w/ BS=168, A1c=9.2 & we discussed restarting METFORMIN 500mg  Bid along w/ strict DM diet, incr water intake etc...  still smoking & NEEDS TO QUIT... still taking the Simva80 w/ LDL 139 & NEEDS TO START LIP40.  ~  November 13, 2010:  3.33mo ROV & back on diet etc w/ improvement in accuchecks on his Metformin 500mg Bid, he felt that his sugars were too low so he cut back to 1/2 tab Bid & feels better on this dose w/ BS 90-105 he says; todays labs reveal BS=138, A1c=6.8, & OK to continue same dose + diet & exercise; he tells me that his family (father & sister) is pressuring him to see a diabetic specialist since "they really know  how to pin point these things" (what?) & we will set up appt at his request...  Lipids look good on Lip40> see labs below; advised to continue diet & exercise program; advised to QUIT SMOKING; advised DM eye exam etc...  ~  August 20, 2011:  49mo ROV & he has been under the care of Jerome Waller- currently on Metform500Bid & he stopped the Januvia100 (?why);  He had one interval lab in [facility]- A1c done 9/12 was 7.2;  Unfortunately current labs are once again horrible w/ FBS=261, A1c=11.0 and he doesn't have a clue what happened; he thinks elev BS is related to OTC "cold" meds he is currently taking; weight is 177# which is down 10# from our last visit...  Chol is fair on Lip40...  Note too that hg is elev at 70 & he still  smokes ==> we reviewed all these issues w/ the following plan:      Smoking> may partly be the etiology of his elev blood count; must quit all smoking, incr fluid intake & we will f/u CBC, if Hg still elev then we will refer to Heme for further check (check EPO level & JAK genetic marker)...    Chol> on Lip40 but the LDL is still not at goal; needs better diet, same med for now...    DM> maximize the Metform500-2Bid, add Glimep4mg Qam, get on diet & incr fluids; we discussed next step on Gliptin/ Thiazolid pills vs Insulin Rx...    LBP> he saw Jerome Waller 7/12 w/ LBP into his left leg; XRays were neg; MRI was to be done by Ortho but we don't have f/u note & there is nothing in EPIC; pt notes that LBP is better w/ OTC meds... LABS 3/13:  FLP- at goals x LDL=109;  Chems- wnl x BS=261 A1c=11.0;  CBC- Hg=18.0;  Umicroalb- neg...  ~  November 26, 2011:  16mo ROV & Jerome Waller has once again stopped all his meds on his own> he says they figured out that his worsening BS occurred when he switched to Lipitor40 & he blamed that med for the A1c=11.0 at last OV;  He called & we changed him to Crestor 20mg /d;  He stopped his Metformin & Glimepiride on his own & says that his BS has been 120-200 at home==> we discussed all this & suggested to him that to prevent the kind of trouble he had prev (A1c 11-14) that he check his BS often & plan to take Metformin 500mg  1-2 per day if his BS is >150 range (he will try to comply he says)...     He still smokes ~1/2ppd and CBC is worse w/ Hg= 18.3, so at this point I have requested consultation from Hematology to r/o PVera etc (will leave it to Heme to check his EPO level & JAK genetic marker)... LABS 6/13:  BMet- ok x BS=125 A1c=6.8;  CBC- Hg=18.3 & we will refer to HEME...  ~  March 03, 2012:  16mo ROV & Jerome Waller claims his BSs are "great- most around 100" & he has only been taking the Metformin intermittently if BS>250 he says (we reviewed prev recs & he was asked to check BS freq & take  Metform500mg  1-2 daily for BS>150 as he will not take the med regularly); he is due for BMet & A1c- pending...     He has been on Cres20 & due for f/u FLP on this med- pending (recall that he blaims his DM in his prev Lipitor40)...    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    We reviewed prob list, meds, xrays and labs> see below for updates >> he will get Flu shot at work, he says... LABS 9/13:  FLP- ok x LDL=127 on Cres20;  Chems- ok x BS=193 A1c=8.8;  CBC- Hg=18.4.Marland KitchenMarland Kitchen   Problem List:  ALLERGIC RHINITIS (ICD-477.9) - mild symptoms... controlled w/ OTC Claritin or Zyrtek...  CIGARETTE SMOKER (ICD-305.1) - still smokes 1/4-1/2 ppd despite all efforts to get him to quit... he tried and failed Chantix in the past...  ~  CXR 1/09 showed heart normal size, clear lungs, NAD... ~  1/10:  he tells me that he quit smoking 2 days ago, did it on his own!!! ~  5/10:  he tells me now that he's smoking again, < 1/2 ppd... encouraged to quit!!! ~  8/10:  back to 1/2 ppd and he feels this may be the best he can do. ~  2/11-2/12:  down to 1/4 ppd now but can't seem to do any better; offered smoking cessation counselling etc. ~  3/13-6/13:  Still smoking & must quit completely esp in light of his elev Hg=18.0 ~  9/13:  He states decr smoking to "one cig every now & again" but still is not motivated to quit...  HYPERLIPIDEMIA (ICD-272.4) - hx Chol >300 in the past...  ~  FLP 12/07 on Lip40 showed TChol 176, TG 101, HDL 44, LDL 111 ~  FLP 1/09 on Lip40 showed TChol 209, TG 148, HDL 46, LDL 131... rec- change to Simva80. ~  FLP 1/10 ?on Simva80? showed TChol 319, TG 726, HDL 53, LDL 105... rec- new Rx for DM. ~  FLP 5/10 on Simva80 showed TChol 192, TG 83, HDL 48, LDL 128... same med, better diet. ~  FLP 2/11 on  Simva80 showed TChol 186, TG 75, HDL 49, LDL 122 ~  FLP 8/11 on Simva80 showed TChol 208, TG 111, HDL 43, LDL 144... rec ch to Lipitor40 (he never did). ~  FLP 2/12 on Simva80 showed TChol 204, TG 120, HDL 45, LDL 139... rec ch to Lip40 now. ~  FLP 6/12 on Lip40 showed TChol 152, TG 126, HDL 45, LDL 82... Continue Lip40 + diet & exerc. ~  FLP 3/13 on Lip40 showed TChol 178, TG 89, HDL 51, LDL 109 ~  4/13:  Pt decided that the Lipitor caused his DM & he wants off this med; switched to CRESTOR 20mg /d. ~  FLP 9/13 on Cres20 showed TChol 199, TG 120, HDL 48, LDL 127... Continue Cres20, better low chol diet.  DIABETES MELLITUS (ICD-250.00) - new Dx [name]> started on Metformin & Glimepiride;  His treatment has been complicated by lack of pt compliance & freq med changes on his own...  ~  labs 1/10 showed BS= 386, HgA1c= 14.6.Marland Kitchen. rec> started Metformin1000Bid + diet/ exerc. ~  labs 2/10 showed BS= 107, HgA1c= 10.7.Marland Kitchen. rec> add Glimep4mg /d. ~  labs 5/10 showed BS= 107, HgA1c= 5.5.Marland Kitchen. rec> decr Glimep4 to 1/2 tabQam. ~  labs 8/10 showed BS= 100, A1c= 5.4.Marland Kitchen. rec> stop the Glimep, & taper Metform. ~  labs 2/11 showed BS= 114, A1c= 6.3.Marland KitchenMarland Kitchen continue diet + exercise. ~  labs 8/11 showed BS= 100, A1c= 6.2 ~  labs 2/12 (back on Metform) showed BS= 168,  A1c= 9.2.Marland Kitchen. rec> start Metform500Bid +diet, +water. ~  Labs 6/12 on Metform250Bid showed BS= 138, A1c= 6.8.Marland KitchenMarland Kitchen OK to continue same dose + diet/ exerc. ~  6/12:  Family requested Endocrine consult w/ Jerome Waller- his notes are reviewed & pt switched to MetformER500Bid. ~  9/12: A1c= 7.2 & Actos 45mg /d added (?he never filled it) & ?insurance changed to Januvia 100mg /d but he didn't stick w/ it. ~  Labs 3/13 on Metform500Bid showed BS=261, A1c=11.0; REC> incr Metform500-2Bid, add Glimep4mg /d... ~  6/13:  Pt decided it was the Lipitor that caused his DM; stopped Lip40 & changed to Cres20; he stopped his Metform&Glimep and follow up labs showed BS= 125, A1c= 6.8==> we  discussed checking BS freq at home & taking Metformin 500mg  if BS>150, he likes this idea...  ~  9/13:  Pt taking Metform500mg  "as needed" & claims that BS at home has mostly been ~100; Labs showed BS=193, A1c=8.8 indicating poor control once again; he is asked to take Metformin 500mg Bid regularly + diet & exercise...  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  LBP >> he went to [facility], Jerome Waller, for LBP & radiation to left leg; XRays were reported normal; MRI was ordered but we do not have the reusults & did not receive a follow up note...   WRIST PAIN (ICD-719.43) - eval by DrKuzma w/ MRI showing scapholunate ligament disruption... Rx splint, Mobic, consideration of surgery.  Hx of HEADACHE (ICD-784.0)  ELEVATED HEMOGLOBIN >> c/w erythrocytosis... ~  Labs 1/10 showed Hg= 17.1 ~  Labs 2/11 showed Hg= 17.5 ~  Labs in 2012 showed Hg= 17.4 to 18.0 ~  Labs 3/13 showed Hg= 18.0, other parameters are wnl... ~  Labs 6/13 showed Hg= 18.3 and we decided to refer pt to HEMATOLOGY for further eval. ~  7/13:  Seen by DrShadad for Heme> he felt that elev Hg likely due to secondary causes- advised quit smoking, drink plenty of water, etc... ~  Labs 9/13 showed Hg= 18.4 & blood is too thick, must quit smoking, increase water intake, DrShadad didn't feel he needed further work up.  Health Maintenance:  rec to take ASA 81mg /d... ~  GI:  Age 22- no hx GI problems, hematochezia, etc... ~  GU:  He is established w/ DrOttelin due to condyloma... ~  Immunizations:  he is rec to get the yearly Flu vaccines...    Past Surgical History  Procedure Date  . Mandible surgery 1988    for underbite    Outpatient Encounter Prescriptions as of 03/03/2012  Medication Sig Dispense Refill  . aspirin 81 MG tablet Take 81 mg by mouth daily.        Marland Kitchen glucose blood (BAYER CONTOUR TEST) test strip Use as instructed to check  blood sugar       . metFORMIN (GLUCOPHAGE-XR) 500 MG 24 hr tablet Take as directed      . Multiple Vitamin (MULTIVITAMIN) capsule Take 1 capsule by mouth daily.        . Omega-3 Fatty Acids (FISH OIL) 1200 MG CAPS Take 1 capsule by mouth daily.        . rosuvastatin (CRESTOR) 20 MG tablet Take 1 tablet (20 mg total) by mouth daily.  30 tablet  11    Allergies ==> No Known Drug Allergies...   Current Medications, Allergies, Past Medical History, Past Surgical History, Family History, and Social History were reviewed in [employer]  electronic medical record.    Review of Systems        See HPI - all other systems neg except as noted... The patient denies anorexia, fever, weight loss, weight gain, vision loss, decreased hearing, hoarseness, chest pain, syncope, dyspnea on exertion, peripheral edema, prolonged cough, headaches, hemoptysis, abdominal pain, melena, hematochezia, severe indigestion/heartburn, hematuria, incontinence, muscle weakness, suspicious skin lesions, transient blindness, difficulty walking, depression, unusual weight change, abnormal bleeding, enlarged lymph nodes, and angioedema.    Objective:   Physical Exam      WD, WN, 46 y/o WM in NAD... GENERAL:  Alert & oriented; pleasant & cooperative... HEENT:  [place]/AT, EOM-wnl, PERRLA, EACs-clear, TMs-wnl, NOSE-clear, THROAT-clear & wnl. NECK:  Supple w/ full ROM; no JVD; normal carotid impulses w/o bruits; no thyromegaly or nodules palpated; no lymphadenopathy. CHEST:  Clear to P & A; without wheezes/ rales/ or rhonchi. HEART:  Regular Rhythm; without murmurs/ rubs/ or gallops. ABDOMEN:  Soft & nontender; normal bowel sounds; no organomegaly or masses detected. EXT: without deformities, mild arthritic change in R wrist; no varicose veins/ venous insuffic/ or edema. NEURO:  CN's intact, no focal neuro deficits... SKIN:  WNL, no lesions seen...  RADIOLOGY DATA:  Reviewed in the EPIC EMR & discussed w/ the  patient...  LABORATORY DATA:  Reviewed in the EPIC EMR & discussed w/ the patient...   Assessment & Plan:    SMOKER>  We discussed smoking cessation strategies but he is not motivated to quit; he understands the relationship of smoking to his "thick blood" & poor blood flow consequences...  HYPERLIPIDEMIA>  FLP was much improved on Lip40 + his diet/ exercise/ etc; but he decided (2013) that the Lipitor caused his DM & refused this med; switched to Cres20 & f/u FLP ok x LDL=127; rec to continue same med daily & get on better low carb diet...  DM>  Jerome Waller tried him on Actos (insurance wouldn't fill) and Januvia (he didn't stick w/ it); labs ret w/ A1c=11 & we reviewed the avail treatments and their costs/ benefits/ side effects/ etc... Started on Metform500-2Bid, Glimep4mg  Qam + diet etc;  He stopped these on his own & states that his DM was caused by the Lipitor & resolved off this med; his BS at home are all around 100 now he says;  He is rec to check BS regularly & take 500mg  Metformin if BS>150 range... Boubacar definitely has his own ideas about things & not interested in my opinion... 9/13 labs showed BS=193 A1c=8.8 on his intermittent as needed regimen- clearly a failure; pt is asked to take Metformin 500mg Bid regularly as monotherapy so we can assess this efficacy...  LBP>  He was eval by [facility] 7/12...  Elev Hemoglobin>  Hg was 18.3 & we referred him for Heme consult (consider EPO level & JAK mutation genetics); seen by DrShadad who doubted signif prob here, didn't think he needed further eval, & rec to improve poss secondary causes- rec to quit smoking, stay well hydrated... 9/13> f/u Hg=18.4  Other medical issues as noted...   Patient's Medications  New Prescriptions   No medications on file  Previous Medications   ASPIRIN 81 MG TABLET    Take 81 mg by mouth daily.     GLUCOSE BLOOD (BAYER CONTOUR TEST) TEST STRIP    Use as instructed to check blood sugar    METFORMIN  (GLUCOPHAGE-XR) 500 MG 24 HR TABLET    Take as directed   MULTIPLE VITAMIN (MULTIVITAMIN) CAPSULE    Take  1 capsule by mouth daily.     OMEGA-3 FATTY ACIDS (FISH OIL) 1200 MG CAPS    Take 1 capsule by mouth daily.     ROSUVASTATIN (CRESTOR) 20 MG TABLET    Take 1 tablet (20 mg total) by mouth daily.  Modified Medications   No medications on file  Discontinued Medications   No medications on file

## 2012-03-10 ENCOUNTER — Other Ambulatory Visit (INDEPENDENT_AMBULATORY_CARE_PROVIDER_SITE_OTHER): Payer: BC Managed Care – PPO

## 2012-03-10 DIAGNOSIS — E119 Type 2 diabetes mellitus without complications: Secondary | ICD-10-CM

## 2012-03-10 DIAGNOSIS — D751 Secondary polycythemia: Secondary | ICD-10-CM

## 2012-03-10 DIAGNOSIS — F17200 Nicotine dependence, unspecified, uncomplicated: Secondary | ICD-10-CM

## 2012-03-10 DIAGNOSIS — E785 Hyperlipidemia, unspecified: Secondary | ICD-10-CM

## 2012-03-10 LAB — CBC WITH DIFFERENTIAL/PLATELET
Eosinophils Absolute: 0.2 10*3/uL (ref 0.0–0.7)
Eosinophils Relative: 3.7 % (ref 0.0–5.0)
MCHC: 34.1 g/dL (ref 30.0–36.0)
MCV: 97.1 fl (ref 78.0–100.0)
Monocytes Absolute: 0.4 10*3/uL (ref 0.1–1.0)
Neutrophils Relative %: 49.3 % (ref 43.0–77.0)
Platelets: 191 10*3/uL (ref 150.0–400.0)
WBC: 5.8 10*3/uL (ref 4.5–10.5)

## 2012-03-10 LAB — LIPID PANEL
Cholesterol: 199 mg/dL (ref 0–200)
LDL Cholesterol: 127 mg/dL — ABNORMAL HIGH (ref 0–99)

## 2012-03-10 LAB — HEPATIC FUNCTION PANEL
ALT: 34 U/L (ref 0–53)
AST: 21 U/L (ref 0–37)
Total Bilirubin: 1.5 mg/dL — ABNORMAL HIGH (ref 0.3–1.2)

## 2012-03-10 LAB — BASIC METABOLIC PANEL
BUN: 18 mg/dL (ref 6–23)
CO2: 30 mEq/L (ref 19–32)
Chloride: 103 mEq/L (ref 96–112)
Potassium: 5.1 mEq/L (ref 3.5–5.1)

## 2012-03-10 LAB — HEMOGLOBIN A1C: Hgb A1c MFr Bld: 8.8 % — ABNORMAL HIGH (ref 4.6–6.5)

## 2012-05-31 ENCOUNTER — Telehealth: Payer: Self-pay | Admitting: Pulmonary Disease

## 2012-05-31 MED ORDER — AMOXICILLIN-POT CLAVULANATE 875-125 MG PO TABS: 1.0000 | ORAL_TABLET | Freq: Two times a day (BID) | ORAL | Status: DC

## 2012-05-31 NOTE — Telephone Encounter (Signed)
I spoke with spouse and she stated pt reports nasal congestion, sore throat, PND, cough w/ tint brown phlem, facial pressure x Sunday. coough is worse at night and is keeping him awake No f/c/s/n/v. He has been taking allegra D. Please advise SN thanks Last OV 03/03/12 Pending OV 09/01/12 No Known Allergies

## 2012-05-31 NOTE — Telephone Encounter (Signed)
Per SN---ok to call in augmentin 875 mg  #14  1 po bid, mucinex 600 mg  2 po bid with plenty of fluids, nasal saline every 1-2 hours prn and align once daily. thanks

## 2012-05-31 NOTE — Telephone Encounter (Signed)
Rx has been sent in. Pt wife aware of Dr. Jodelle Green recommendations.

## 2012-06-09 ENCOUNTER — Telehealth: Payer: Self-pay | Admitting: Pulmonary Disease

## 2012-06-09 ENCOUNTER — Telehealth: Payer: Self-pay | Admitting: Critical Care Medicine

## 2012-06-09 MED ORDER — PREDNISONE 10 MG PO TABS: ORAL_TABLET | ORAL | Status: DC

## 2012-06-09 MED ORDER — AZITHROMYCIN 250 MG PO TABS: ORAL_TABLET | ORAL | Status: DC

## 2012-06-09 MED ORDER — HYDROCODONE-HOMATROPINE 5-1.5 MG/5ML PO SYRP: 5.0000 mL | ORAL_SOLUTION | Freq: Four times a day (QID) | ORAL | Status: DC | PRN

## 2012-06-09 NOTE — Telephone Encounter (Signed)
Spoke with the pt  He is c/o having sinus drainage and prod cough with brown, thick sputum This started around 05/31/12- he called and was prescribed augmentin 875 mg # 14  This helped some, but "needs something else to help knock it out" RB please advise thanks! No Known Allergies

## 2012-06-09 NOTE — Telephone Encounter (Signed)
SInce the pt will not be making in an appt today, requesting a ZPak, prednisone & a cough syrup be called into [facility].  Antionette Fairy

## 2012-06-09 NOTE — Telephone Encounter (Signed)
Pt wants a cough syrup.  I called in Hycodan cough syrup  Unable to send via eprescribe or fax from [facility]

## 2012-06-09 NOTE — Telephone Encounter (Signed)
Message copied from earlier message closed in error.  Jerome Waller   Jerome Waller 06/09/2012 4:53 PM Signed  SInce the pt will not be making in an appt today, requesting a ZPak, prednisone & a cough syrup be called into [facility]. Jerome Waller   [name] 06/09/2012 3:46 PM Signed  Calling again in ref to previous msg says they need a call asap because they live about 45 minutes away.[name]            Encounter MyChart Messages     No messages in this encounter         Routing History       Priority  Sent On  From  To  Message Type     06/09/2012 4:53 PM  Jerome Waller  [facility] Triage Pool  Patient Calls     06/09/2012 4:35 PM  [name]  [facility] Triage Pool  Patient Calls     06/09/2012 3:46 PM  [name]  [facility] Triage Pool  Patient Calls     06/09/2012 3:46 PM  [name]  [facility] Triage Pool  Patient Calls     06/09/2012 3:08 PM  [name]  [facility] Triage Pool  Patient Calls     06/09/2012 1:32 PM  [name]  [facility] Triage Pool  Patient Calls       Created by     [name] on 06/09/2012 1:30 PM                           Visit Pharmacy     [facility] - [place],  - [address]         Contacts         Type  Contact  Phone    06/09/2012 1:30 PM  Phone (Incoming)  Kimmy, Parish (Spouse)  [phone]    States that pt needs to come in and get a depo medrol today please advise.

## 2012-06-09 NOTE — Telephone Encounter (Signed)
Duplicate msg.

## 2012-06-09 NOTE — Telephone Encounter (Signed)
Spoke with patient spouse, aware the Dr. Delton Coombes has prescribed z pack and pred taper.  Sent into pharmacy and nothing further needed.

## 2012-06-09 NOTE — Telephone Encounter (Signed)
He needs to keep taking the Augmentin to completion. Start Pred taper: Take 40mg  daily for 3 days, then 30mg  daily for 3 days, then 20mg  daily for 3 days, then 10mg  daily for 3 days, then stop

## 2012-06-09 NOTE — Addendum Note (Signed)
Addended by: Orma Flaming D on: 06/09/2012 05:32 PM   Modules accepted: Orders

## 2012-06-09 NOTE — Telephone Encounter (Signed)
Calling again in ref to previous msg says they need a call asap because they live about 45 minutes away.Jerome Waller

## 2012-08-31 ENCOUNTER — Telehealth: Payer: Self-pay | Admitting: Pulmonary Disease

## 2012-08-31 DIAGNOSIS — E119 Type 2 diabetes mellitus without complications: Secondary | ICD-10-CM

## 2012-08-31 DIAGNOSIS — D582 Other hemoglobinopathies: Secondary | ICD-10-CM

## 2012-08-31 NOTE — Telephone Encounter (Signed)
Labs have been placed in the computer for the pt.  lmom to make him aware. Nothing further is needed.

## 2012-09-01 ENCOUNTER — Ambulatory Visit (INDEPENDENT_AMBULATORY_CARE_PROVIDER_SITE_OTHER): Payer: BC Managed Care – PPO | Admitting: Pulmonary Disease

## 2012-09-01 ENCOUNTER — Encounter: Payer: Self-pay | Admitting: Pulmonary Disease

## 2012-09-01 VITALS — BP 124/86 | HR 87 | Temp 98.7°F | Ht 72.0 in | Wt 175.2 lb

## 2012-09-01 DIAGNOSIS — E785 Hyperlipidemia, unspecified: Secondary | ICD-10-CM

## 2012-09-01 DIAGNOSIS — F17200 Nicotine dependence, unspecified, uncomplicated: Secondary | ICD-10-CM

## 2012-09-01 DIAGNOSIS — F411 Generalized anxiety disorder: Secondary | ICD-10-CM

## 2012-09-01 DIAGNOSIS — E119 Type 2 diabetes mellitus without complications: Secondary | ICD-10-CM

## 2012-09-01 DIAGNOSIS — I83893 Varicose veins of bilateral lower extremities with other complications: Secondary | ICD-10-CM

## 2012-09-01 DIAGNOSIS — D751 Secondary polycythemia: Secondary | ICD-10-CM

## 2012-09-01 MED ORDER — METFORMIN HCL ER 500 MG PO TB24: ORAL_TABLET | ORAL | Status: DC

## 2012-09-01 NOTE — Patient Instructions (Addendum)
Today we updated your med list in our EPIC system...    Continue your current medications the same for now...  Please return to our lab one morning next week for your FASTING blood work...    We will contact you w/ the results when available...   Keep up the good work w/ your low carb diet, taking your meds regularly, & exercise...  We wrote a prescription for support hose for your varicose veins (try 20-66mmHg compression)...  Call for any questions...  Let's continue our 6 month follow up visits.Marland KitchenMarland Kitchen

## 2012-09-01 NOTE — Progress Notes (Addendum)
Subjective:    Patient ID: Jerome Waller, male    DOB: [birth year], 47 y.o.   MRN: [id number]  HPI 47 y/o WM here for a follow up visit... he has multiple medical problems as noted below...    ~  SEE PREV EPIC ENTRIES FOR OLD DATA FROM 2010 - 2013...  ~  August 20, 2011:  51mo ROV & he has been under the care of DrEllison- currently on Metform500Bid & he stopped the Januvia100 (?why);  He had one interval lab in EPIC- A1c done 9/12 was 7.2;  Unfortunately current labs are once again horrible w/ FBS=261, A1c=11.0 and he doesn't have a clue what happened; he thinks elev BS is related to OTC "cold" meds he is currently taking; weight is 177# which is down 10# from our last visit...  Chol is fair on Lip40...  Note too that hg is elev at 88 & he still smokes ==> we reviewed all these issues w/ the following plan:      Smoking> may partly be the etiology of his elev blood count; must quit all smoking, incr fluid intake & we will f/u CBC, if Hg still elev then we will refer to Heme for further check (check EPO level & JAK genetic marker)...    Chol> on Lip40 but the LDL is still not at goal; needs better diet, same med for now...    DM> maximize the Metform500-2Bid, add Glimep4mg Qam, get on diet & incr fluids; we discussed next step on Gliptin/ Thiazolid pills vs Insulin Rx...    LBP> he saw DrGioffre 7/12 w/ LBP into his left leg; XRays were neg; MRI was to be done by Ortho but we don't have f/u note & there is nothing in EPIC; pt notes that LBP is better w/ OTC meds... LABS 3/13:  FLP- at goals x LDL=109;  Chems- wnl x BS=261 A1c=11.0;  CBC- Hg=18.0;  Umicroalb- neg...  ~  November 26, 2011:  49mo ROV & Jerome Waller has once again stopped all his meds on his own> he says they figured out that his worsening BS occurred when he switched to Lipitor40 & he blamed that med for the A1c=11.0 at last OV;  He called & we changed him to Crestor 20mg /d;  He stopped his Metformin & Glimepiride on his own & says that his BS has  been 120-200 at home==> we discussed all this & suggested to him that to prevent the kind of trouble he had prev (A1c 11-14) that he check his BS often & plan to take Metformin 500mg  1-2 per day if his BS is >150 range (he will try to comply he says)...     He still smokes ~1/2ppd and CBC is worse w/ Hg= 18.3, so at this point I have requested consultation from Hematology to r/o PVera etc (will leave it to Heme to check his EPO level & JAK genetic marker)... LABS 6/13:  BMet- ok x BS=125 A1c=6.8;  CBC- Hg=18.3 & we will refer to HEME...  ~  March 03, 2012:  49mo ROV & Jerome Waller claims his BSs are "great- most around 100" & he has only been taking the Metformin intermittently if BS>250 he says (we reviewed prev recs & he was asked to check BS freq & take Metform500mg  1-2 daily for BS>150 as he will not take the med regularly); he is due for BMet & A1c- pending...     He has been on Cres20 & due for f/u FLP on this med- pending (  recall that he blaims his DM in his prev Lipitor40)...    He saw DrShadad for Erythrocytosis7/13> Hg was 18.3 when referred & DrShadad felt it was likely to be secondary & pt again asked to stop smoking etc; he did not think this was PVera & did not think he needed further eval at this time...    We reviewed prob list, meds, xrays and labs> see below for updates >> he will get Flu shot at work, he says... LABS 9/13:  FLP- ok x LDL=127 on Cres20;  Chems- ok x BS=193 A1c=8.8;  CBC- Hg=18.4...  ~  September 01, 2012:  59mo ROV & Jerome Waller reports that he is doing well- feeling good, no new complaints or concerns;  We reviewed the following medical problems during today's office visit >>     Smoking> still smoking regularly & admits to <1/2ppd he says; denies cough, sput, hemoptysis, SOB, CP, etc; he again declines smoking cessation help...    Varicose Veins> he has VV & venous insuffic changes but no edema; he requests support hose Rx/ TEDs just like a co-worker; Rec 20-85mmHg compression...     Chol> on Cres20 & is taking it regularly he says (confirmed w/ [facility]); FLP shows TChol 173, TG 91, HDL, 55, LDL 100...    DM> on Metform500Bid & taking regularly confirmed by Pharm; NOT checking BS at home ("mother-in-law took my meter"); Labs show BS=230, A1c=11.2, & rec to incr Metform to 1000mg Bid & add Glimep4mg  Qam... ROV 57mo...    LBP> he saw DrGioffre 7/12 w/ LBP into his left leg; XRays were neg; MRI was to be done by Ortho but we don't have f/u note & there is nothing in EPIC; pt notes that LBP is better w/ OTC meds...    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 We reviewed prob list, meds, xrays and labs> see below for updates >> he had the 2013 flu vaccine 9/13...  LABS 3/14:  FLP- at goals on Cres;  Chems- ok x BS=230 A1c=11.2;  CBC- ok w/ Hg=18.2 unchanged;  TSH=0.99;  Urine- neg for prot...          Problem List:  ALLERGIC RHINITIS (ICD-477.9) - mild symptoms... controlled w/ OTC Claritin or Zyrtek...  CIGARETTE SMOKER (ICD-305.1) - still smokes 1/4-1/2 ppd despite all efforts to get him to quit... he tried and failed Chantix in the past...  ~  CXR 1/09 showed heart normal size, clear lungs, NAD... ~  1/10:  he tells me that he quit smoking 2 days ago, did it on his own!!! ~  5/10:  he tells me now that he's smoking again, < 1/2 ppd... encouraged to quit!!! ~  8/10:  back to 1/2 ppd and he feels this may be the best he can do. ~  2/11-2/12:  down to 1/4 ppd now but can't seem to do any better; offered smoking cessation counselling etc. ~  3/13-6/13:  Still smoking & must quit completely esp in light of his elev Hg=18.0 ~  9/13:  He states decr smoking to "one cig every now & again" but  still is not motivated to quit... ~  3/14: still smoking regularly &  admits to <1/2ppd he says; denies cough, sput, hemoptysis, SOB, CP, etc; he again declines smoking cessation help.  VARICOSE VEINS >> he has VV and trophic changes of Ven Insuffic in LEs without edema; we discussed the use of support hose- try 20-39mm compression...  HYPERLIPIDEMIA (ICD-272.4) - hx Chol >300 in the past...  ~  FLP 12/07 on Lip40 showed TChol 176, TG 101, HDL 44, LDL 111 ~  FLP 1/09 on Lip40 showed TChol 209, TG 148, HDL 46, LDL 131... rec- change to Simva80. ~  FLP 1/10 ?on Simva80? showed TChol 319, TG 726, HDL 53, LDL 105... rec- new Rx for DM. ~  FLP 5/10 on Simva80 showed TChol 192, TG 83, HDL 48, LDL 128... same med, better diet. ~  FLP 2/11 on Simva80 showed TChol 186, TG 75, HDL 49, LDL 122 ~  FLP 8/11 on Simva80 showed TChol 208, TG 111, HDL 43, LDL 144... rec ch to Lipitor40 (he never did). ~  FLP 2/12 on Simva80 showed TChol 204, TG 120, HDL 45, LDL 139... rec ch to Lip40 now. ~  FLP 6/12 on Lip40 showed TChol 152, TG 126, HDL 45, LDL 82... Continue Lip40 + diet & exerc. ~  FLP 3/13 on Lip40 showed TChol 178, TG 89, HDL 51, LDL 109 ~  4/13:  Pt decided that the Lipitor caused his DM & he wants off this med; switched to CRESTOR 20mg /d. ~  FLP 9/13 on Cres20 showed TChol 199, TG 120, HDL 48, LDL 127... Continue Cres20, better low chol diet. ~  FLP 3/14 on Cres20 showed TChol 173, TG 91, HDL, 55, LDL 100  DIABETES MELLITUS (ICD-250.00) - new Dx [name]> started on Metformin & Glimepiride;  His treatment has been complicated by lack of pt compliance & freq med changes on his own...  ~  labs 1/10 showed BS= 386, HgA1c= 14.6.Marland Kitchen. rec> started Metformin1000Bid + diet/ exerc. ~  labs 2/10 showed BS= 107, HgA1c= 10.7.Marland Kitchen. rec> add Glimep4mg /d. ~  labs 5/10 showed BS= 107, HgA1c= 5.5.Marland Kitchen. rec> decr Glimep4 to 1/2 tabQam. ~  labs 8/10 showed BS= 100, A1c= 5.4.Marland Kitchen. rec> stop the Glimep, & taper Metform. ~  labs 2/11  showed BS= 114, A1c= 6.3.Marland KitchenMarland Kitchen continue diet + exercise. ~  labs 8/11 showed BS= 100, A1c= 6.2 ~  labs 2/12 (back on Metform) showed BS= 168, A1c= 9.2.Marland Kitchen. rec> start Metform500Bid +diet, +water. ~  Labs 6/12 on Metform250Bid showed BS= 138, A1c= 6.8.Marland KitchenMarland Kitchen OK to continue same dose + diet/ exerc. ~  6/12:  Family requested Endocrine consult w/ DrEllison- his notes are reviewed & pt switched to MetformER500Bid. ~  9/12: A1c= 7.2 & Actos 45mg /d added (?he never filled it) & ?insurance changed to Januvia 100mg /d but he didn't stick w/ it. ~  Labs 3/13 on Metform500Bid showed BS=261, A1c=11.0; REC> incr Metform500-2Bid, add Glimep4mg /d... ~  6/13:  Pt decided it was the Lipitor that caused his DM; stopped Lip40 & changed to Cres20; he stopped his Metform&Glimep and follow up labs showed BS= 125, A1c= 6.8==> we discussed checking BS freq at home & taking Metformin 500mg  if BS>150, he likes this idea...  ~  9/13:  Pt taking Metform500mg  "as needed" & claims that BS at home has mostly been ~100; Labs showed BS=193, A1c=8.8 indicating poor control once again; he is asked to take Metformin 500mg Bid regularly + diet & exercise... ~  Labs 3/14 on Metform500Bid showed BS= 230, A1c= 11.2.Marland KitchenMarland Kitchen Rec- incr Metform500-2Bid & add Glimep4mg /d...  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  LBP >> he went to [facility], DrGioffre, for LBP & radiation to left leg; XRays were reported normal; MRI was ordered but we do not have the reusults & did not receive a follow up note...   WRIST PAIN (ICD-719.43) - eval by DrKuzma w/ MRI showing scapholunate ligament disruption... Rx splint, Mobic, consideration of surgery.  Hx of HEADACHE (ICD-784.0)  ELEVATED HEMOGLOBIN >> c/w erythrocytosis... ~  Labs 1/10 showed Hg= 17.1 ~  Labs 2/11 showed Hg= 17.5 ~  Labs in 2012 showed Hg= 17.4 to 18.0 ~  Labs 3/13 showed Hg= 18.0, other parameters are  wnl... ~  Labs 6/13 showed Hg= 18.3 and we decided to refer pt to HEMATOLOGY for further eval. ~  7/13:  Seen by DrShadad for Heme> he felt that elev Hg likely due to secondary causes- advised quit smoking, drink plenty of water, etc... ~  Labs 9/13 showed Hg= 18.4 & blood is too thick, must quit smoking, increase water intake, DrShadad didn't feel he needed further work up. ~  Labs 3/14 showed Hg= 18.2, unchanged & he is again asked to quit all the smoking...  Health Maintenance:  rec to take ASA 81mg /d... ~  GI:  Age 40- no hx GI problems, hematochezia, etc... ~  GU:  He is established w/ DrOttelin due to condyloma... ~  Immunizations:  he is rec to get the yearly Flu vaccines...    Past Surgical History  Procedure Laterality Date  . Mandible surgery  1988    for underbite    Outpatient Encounter Prescriptions as of 09/01/2012  Medication Sig Dispense Refill  . aspirin 81 MG tablet Take 81 mg by mouth daily.        Marland Kitchen glucose blood (BAYER CONTOUR TEST) test strip Use as instructed to check blood sugar       . metFORMIN (GLUCOPHAGE-XR) 500 MG 24 hr tablet Take as directed      . Multiple Vitamin (MULTIVITAMIN) capsule Take 1 capsule by mouth daily.        . Omega-3 Fatty Acids (FISH OIL) 1200 MG CAPS Take 1 capsule by mouth daily.        . rosuvastatin (CRESTOR) 20 MG tablet Take 1 tablet (20 mg total) by mouth daily.  30 tablet  11  . [DISCONTINUED] amoxicillin-clavulanate (AUGMENTIN) 875-125 MG per tablet Take 1 tablet by mouth 2 (two) times daily.  14 tablet  0  . [DISCONTINUED] azithromycin (ZITHROMAX Z-PAK) 250 MG tablet Take As Directed  6 each  0  . [DISCONTINUED] HYDROcodone-homatropine (HYCODAN) 5-1.5 MG/5ML syrup Take 5 mLs by mouth every 6 (six) hours as needed for cough.  240 mL  0  . [DISCONTINUED] predniSONE (DELTASONE) 10 MG tablet 40mg  x3days, 30mg  x3 days, 20mg  x3 days, 10mg  x3 days then stop  30 tablet  0   No facility-administered encounter medications on file as of  09/01/2012.    Allergies ==> No Known Drug Allergies...   Current Medications, Allergies, Past Medical History, Past Surgical History, Family History, and Social History were reviewed in [employer] record.    Review of Systems        See HPI - all other systems neg except as noted... The patient denies anorexia, fever, weight loss, weight gain, vision loss, decreased hearing, hoarseness, chest pain, syncope, dyspnea on exertion, peripheral edema, prolonged cough, headaches, hemoptysis, abdominal pain, melena, hematochezia, severe  indigestion/heartburn, hematuria, incontinence, muscle weakness, suspicious skin lesions, transient blindness, difficulty walking, depression, unusual weight change, abnormal bleeding, enlarged lymph nodes, and angioedema.    Objective:   Physical Exam      WD, WN, 47 y/o WM in NAD... GENERAL:  Alert & oriented; pleasant & cooperative... HEENT:  [place]/AT, EOM-wnl, PERRLA, EACs-clear, TMs-wnl, NOSE-clear, THROAT-clear & wnl. NECK:  Supple w/ full ROM; no JVD; normal carotid impulses w/o bruits; no thyromegaly or nodules palpated; no lymphadenopathy. CHEST:  Clear to P & A; without wheezes/ rales/ or rhonchi. HEART:  Regular Rhythm; without murmurs/ rubs/ or gallops. ABDOMEN:  Soft & nontender; normal bowel sounds; no organomegaly or masses detected. EXT: without deformities, mild arthritic change in R wrist; +varicose veins/ +venous insuffic/ no edema. NEURO:  CN's intact, no focal neuro deficits... SKIN:  WNL, no lesions seen...  RADIOLOGY DATA:  Reviewed in the EPIC EMR & discussed w/ the patient...  LABORATORY DATA:  Reviewed in the EPIC EMR & discussed w/ the patient...   Assessment & Plan:    SMOKER>  We discussed smoking cessation strategies but he is not motivated to quit; he understands the relationship of smoking to his "thick blood" & poor blood flow consequences...  Varicose Veins>  Rx written for compression stockings (try  20-53mm Hg compression)...  HYPERLIPIDEMIA>  FLP was much improved on Lip40 + his diet/ exercise/ etc; but he decided (2013) that the Lipitor caused his DM & refused this med; switched to Cres20 & f/u FLP is at the goals now...  DM>  on Metform500Bid regularly but his labs show BS=230, A1c=11.2 7 he is asked to incr Metform500-2Bid + add glimep4mg  Qam...  LBP>  He was eval by [facility] 7/12...  Elev Hemoglobin>  Hg was 18.3 & we referred him for Heme consult (consider EPO level & JAK mutation genetics); seen by DrShadad who doubted signif prob here, didn't think he needed further eval, & rec to improve poss secondary causes- rec to quit smoking, stay well hydrated... 9/13> f/u Hg=18.4; 3/14> Hg= 18.2  Other medical issues as noted...   Patient's Medications  New Prescriptions   No medications on file  Previous Medications   ASPIRIN 81 MG TABLET    Take 81 mg by mouth daily.     GLUCOSE BLOOD (BAYER CONTOUR TEST) TEST STRIP    Use as instructed to check blood sugar    MULTIPLE VITAMIN (MULTIVITAMIN) CAPSULE    Take 1 capsule by mouth daily.     OMEGA-3 FATTY ACIDS (FISH OIL) 1200 MG CAPS    Take 1 capsule by mouth daily.     ROSUVASTATIN (CRESTOR) 20 MG TABLET    Take 1 tablet (20 mg total) by mouth daily.  Modified Medications   Modified Medication Previous Medication   METFORMIN (GLUCOPHAGE-XR) 500 MG 24 HR TABLET metFORMIN (GLUCOPHAGE-XR) 500 MG 24 hr tablet      Take as directed    Take as directed  Discontinued Medications   AMOXICILLIN-CLAVULANATE (AUGMENTIN) 875-125 MG PER TABLET    Take 1 tablet by mouth 2 (two) times daily.   AZITHROMYCIN (ZITHROMAX Z-PAK) 250 MG TABLET    Take As Directed   HYDROCODONE-HOMATROPINE (HYCODAN) 5-1.5 MG/5ML SYRUP    Take 5 mLs by mouth every 6 (six) hours as needed for cough.   PREDNISONE (DELTASONE) 10 MG TABLET    40mg  x3days, 30mg  x3 days, 20mg  x3 days, 10mg  x3 days then stop

## 2012-09-05 ENCOUNTER — Telehealth: Payer: Self-pay | Admitting: Pulmonary Disease

## 2012-09-05 MED ORDER — GLUCOSE BLOOD VI STRP: ORAL_STRIP | Status: AC

## 2012-09-05 NOTE — Telephone Encounter (Signed)
Refill sent and pt is aware. Jennifer Castillo, CMA  

## 2012-09-08 ENCOUNTER — Other Ambulatory Visit (INDEPENDENT_AMBULATORY_CARE_PROVIDER_SITE_OTHER): Payer: BC Managed Care – PPO

## 2012-09-08 DIAGNOSIS — E119 Type 2 diabetes mellitus without complications: Secondary | ICD-10-CM

## 2012-09-08 DIAGNOSIS — E785 Hyperlipidemia, unspecified: Secondary | ICD-10-CM

## 2012-09-08 DIAGNOSIS — D582 Other hemoglobinopathies: Secondary | ICD-10-CM

## 2012-09-08 DIAGNOSIS — F411 Generalized anxiety disorder: Secondary | ICD-10-CM

## 2012-09-08 LAB — BASIC METABOLIC PANEL
BUN: 16 mg/dL (ref 6–23)
Calcium: 9.5 mg/dL (ref 8.4–10.5)
Creatinine, Ser: 1 mg/dL (ref 0.4–1.5)
GFR: 86.98 mL/min (ref >60.00)
Potassium: 5.3 mEq/L — ABNORMAL HIGH (ref 3.5–5.1)

## 2012-09-08 LAB — CBC WITH DIFFERENTIAL/PLATELET
Basophils Absolute: 0 10*3/uL (ref 0.0–0.1)
Eosinophils Absolute: 0.2 10*3/uL (ref 0.0–0.7)
Hemoglobin: 18.2 g/dL (ref 13.0–17.0)
Lymphocytes Relative: 35.9 % (ref 12.0–46.0)
Lymphs Abs: 2.5 10*3/uL (ref 0.7–4.0)
MCHC: 34.9 g/dL (ref 30.0–36.0)
MCV: 94.4 fl (ref 78.0–100.0)
Monocytes Absolute: 0.5 10*3/uL (ref 0.1–1.0)
Neutro Abs: 3.6 10*3/uL (ref 1.4–7.7)
RDW: 12.6 % (ref 11.5–14.6)

## 2012-09-08 LAB — HEPATIC FUNCTION PANEL
ALT: 39 U/L (ref 0–53)
Alkaline Phosphatase: 111 U/L (ref 39–117)
Bilirubin, Direct: 0.2 mg/dL (ref 0.0–0.3)
Total Bilirubin: 1.4 mg/dL — ABNORMAL HIGH (ref 0.3–1.2)
Total Protein: 6.9 g/dL (ref 6.0–8.3)

## 2012-09-08 LAB — LIPID PANEL: Cholesterol: 173 mg/dL (ref 0–200)

## 2012-09-11 ENCOUNTER — Other Ambulatory Visit: Payer: Self-pay | Admitting: Pulmonary Disease

## 2012-09-11 MED ORDER — METFORMIN HCL 500 MG PO TABS: 1000.0000 mg | ORAL_TABLET | Freq: Two times a day (BID) | ORAL | Status: DC

## 2012-09-11 MED ORDER — GLIMEPIRIDE 4 MG PO TABS: 4.0000 mg | ORAL_TABLET | Freq: Every day | ORAL | Status: DC

## 2012-09-28 ENCOUNTER — Other Ambulatory Visit: Payer: Self-pay | Admitting: Pulmonary Disease

## 2012-09-28 DIAGNOSIS — E119 Type 2 diabetes mellitus without complications: Secondary | ICD-10-CM

## 2012-10-17 ENCOUNTER — Telehealth: Payer: Self-pay | Admitting: Pulmonary Disease

## 2012-10-17 MED ORDER — ROSUVASTATIN CALCIUM 20 MG PO TABS: 20.0000 mg | ORAL_TABLET | Freq: Every day | ORAL | Status: DC

## 2012-10-17 NOTE — Telephone Encounter (Signed)
Spoke with patients spouse-Karen Patient requesting asap refill on crestor to [facility]. Rx has been filled and Clydie Braun is aware Nothing further needed at this time

## 2012-10-30 ENCOUNTER — Telehealth: Payer: Self-pay | Admitting: Pulmonary Disease

## 2012-10-30 DIAGNOSIS — D582 Other hemoglobinopathies: Secondary | ICD-10-CM

## 2012-10-30 NOTE — Telephone Encounter (Signed)
Please advise if pt needs labs prior to appt thanks

## 2012-11-01 NOTE — Telephone Encounter (Signed)
Labs are in the computer for the pt. Called and spoke with karen and she is aware and nothing further is needed.

## 2012-11-03 ENCOUNTER — Ambulatory Visit: Payer: BC Managed Care – PPO | Admitting: Pulmonary Disease

## 2012-11-23 ENCOUNTER — Ambulatory Visit: Payer: BC Managed Care – PPO | Admitting: Pulmonary Disease

## 2012-11-24 ENCOUNTER — Ambulatory Visit: Payer: BC Managed Care – PPO | Admitting: Pulmonary Disease

## 2012-12-01 ENCOUNTER — Other Ambulatory Visit (INDEPENDENT_AMBULATORY_CARE_PROVIDER_SITE_OTHER): Payer: BC Managed Care – PPO

## 2012-12-01 DIAGNOSIS — D582 Other hemoglobinopathies: Secondary | ICD-10-CM

## 2012-12-01 DIAGNOSIS — E119 Type 2 diabetes mellitus without complications: Secondary | ICD-10-CM

## 2012-12-01 LAB — BASIC METABOLIC PANEL
CO2: 27 mEq/L (ref 19–32)
Chloride: 106 mEq/L (ref 96–112)
Potassium: 4.2 mEq/L (ref 3.5–5.1)
Sodium: 139 mEq/L (ref 135–145)

## 2012-12-01 LAB — CBC WITH DIFFERENTIAL/PLATELET
Basophils Relative: 0.4 % (ref 0.0–3.0)
Eosinophils Absolute: 0.2 10*3/uL (ref 0.0–0.7)
Eosinophils Relative: 2.7 % (ref 0.0–5.0)
Hemoglobin: 17.1 g/dL — ABNORMAL HIGH (ref 13.0–17.0)
MCHC: 34.2 g/dL (ref 30.0–36.0)
MCV: 98.3 fl (ref 78.0–100.0)
Monocytes Absolute: 0.4 10*3/uL (ref 0.1–1.0)
Neutro Abs: 4.1 10*3/uL (ref 1.4–7.7)
Neutrophils Relative %: 55.3 % (ref 43.0–77.0)
RBC: 5.09 Mil/uL (ref 4.22–5.81)
WBC: 7.4 10*3/uL (ref 4.5–10.5)

## 2012-12-01 LAB — HEMOGLOBIN A1C: Hgb A1c MFr Bld: 7 % — ABNORMAL HIGH (ref 4.6–6.5)

## 2012-12-08 ENCOUNTER — Ambulatory Visit: Payer: BC Managed Care – PPO | Admitting: Pulmonary Disease

## 2012-12-08 ENCOUNTER — Ambulatory Visit (INDEPENDENT_AMBULATORY_CARE_PROVIDER_SITE_OTHER): Payer: 59 | Admitting: Pulmonary Disease

## 2012-12-08 ENCOUNTER — Encounter: Payer: Self-pay | Admitting: Pulmonary Disease

## 2012-12-08 VITALS — BP 120/84 | HR 85 | Temp 98.5°F | Ht 72.0 in | Wt 188.0 lb

## 2012-12-08 DIAGNOSIS — D751 Secondary polycythemia: Secondary | ICD-10-CM

## 2012-12-08 DIAGNOSIS — E785 Hyperlipidemia, unspecified: Secondary | ICD-10-CM

## 2012-12-08 DIAGNOSIS — F17200 Nicotine dependence, unspecified, uncomplicated: Secondary | ICD-10-CM

## 2012-12-08 DIAGNOSIS — M545 Low back pain, unspecified: Secondary | ICD-10-CM

## 2012-12-08 DIAGNOSIS — E119 Type 2 diabetes mellitus without complications: Secondary | ICD-10-CM

## 2012-12-08 NOTE — Patient Instructions (Addendum)
Today we updated your med list in our EPIC system...    Continue your current medications the same...  We reviewed your recent blood work 7 your DM numbers are IMPROVED...    Keep up the good work!!!  Call for any questions...  Let's plan a follow up visit in 26mo, sooner if needed for problems.Marland KitchenMarland Kitchen

## 2012-12-08 NOTE — Progress Notes (Signed)
Subjective:    Patient ID: Jerome Waller, male    DOB: 1966/01/16, 47 y.o.   MRN: [id number]  HPI 47 y/o WM here for a follow up visit... he has multiple medical problems as noted below...    ~  SEE PREV EPIC ENTRIES FOR OLD DATA FROM 2010 - 2013...  ~  August 20, 2011:  25mo ROV & he has been under the care of DrEllison- currently on Metform500Bid & he stopped the Januvia100 (?why);  He had one interval lab in EPIC- A1c done 9/12 was 7.2;  Unfortunately current labs are once again horrible w/ FBS=261, A1c=11.0 and he doesn't have a clue what happened; he thinks elev BS is related to OTC "cold" meds he is currently taking; weight is 177# which is down 10# from our last visit...  Chol is fair on Lip40...  Note too that hg is elev at 22 & he still smokes ==> we reviewed all these issues w/ the following plan:      Smoking> may partly be the etiology of his elev blood count; must quit all smoking, incr fluid intake & we will f/u CBC, if Hg still elev then we will refer to Heme for further check (check EPO level & JAK genetic marker)...    Chol> on Lip40 but the LDL is still not at goal; needs better diet, same med for now...    DM> maximize the Metform500-2Bid, add Glimep4mg Qam, get on diet & incr fluids; we discussed next step on Gliptin/ Thiazolid pills vs Insulin Rx...    LBP> he saw DrGioffre 7/12 w/ LBP into his left leg; XRays were neg; MRI was to be done by Ortho but we don't have f/u note & there is nothing in EPIC; pt notes that LBP is better w/ OTC meds... LABS 3/13:  FLP- at goals x LDL=109;  Chems- wnl x BS=261 A1c=11.0;  CBC- Hg=18.0;  Umicroalb- neg...  ~  November 26, 2011:  56mo ROV & Jerome Waller has once again stopped all his meds on his own> he says they figured out that his worsening BS occurred when he switched to Lipitor40 & he blamed that med for the A1c=11.0 at last OV;  He called & we changed him to Crestor 20mg /d;  He stopped his Metformin & Glimepiride on his own & says that his BS has  been 120-200 at home==> we discussed all this & suggested to him that to prevent the kind of trouble he had prev (A1c 11-14) that he check his BS often & plan to take Metformin 500mg  1-2 per day if his BS is >150 range (he will try to comply he says)...     He still smokes ~1/2ppd and CBC is worse w/ Hg= 18.3, so at this point I have requested consultation from Hematology to r/o PVera etc (will leave it to Heme to check his EPO level & JAK genetic marker)... LABS 6/13:  BMet- ok x BS=125 A1c=6.8;  CBC- Hg=18.3 & we will refer to HEME...  ~  March 03, 2012:  56mo ROV & Jerome Waller claims his BSs are "great- most around 100" & he has only been taking the Metformin intermittently if BS>250 he says (we reviewed prev recs & he was asked to check BS freq & take Metform500mg  1-2 daily for BS>150 as he will not take the med regularly); he is due for BMet & A1c- pending...     He has been on Cres20 & due for f/u FLP on this med- pending (  recall that he blaims his DM in his prev Lipitor40)...    He saw DrShadad for Erythrocytosis7/13> Hg was 18.3 when referred & DrShadad felt it was likely to be secondary & pt again asked to stop smoking etc; he did not think this was PVera & did not think he needed further eval at this time...    We reviewed prob list, meds, xrays and labs> see below for updates >> he will get Flu shot at work, he says... LABS 9/13:  FLP- ok x LDL=127 on Cres20;  Chems- ok x BS=193 A1c=8.8;  CBC- Hg=18.4...  ~  September 01, 2012:  4mo ROV & Jerome Waller reports that he is doing well- feeling good, no new complaints or concerns;  We reviewed the following medical problems during today's office visit >>     Smoking> still smoking regularly & admits to <1/2ppd he says; denies cough, sput, hemoptysis, SOB, CP, etc; he again declines smoking cessation help...    Varicose Veins> he has VV & venous insuffic changes but no edema; he requests support hose Rx/ TEDs just like a co-worker; Rec 20-2mmHg compression...     Chol> on Cres20 & is taking it regularly he says (confirmed w/ [facility]); FLP shows TChol 173, TG 91, HDL, 55, LDL 100...    DM> on Metform500Bid & taking regularly confirmed by Pharm; NOT checking BS at home ("mother-in-law took my meter"); Labs show BS=230, A1c=11.2, & rec to incr Metform to 1000mg Bid & add Glimep4mg  Qam... ROV 56mo...    LBP> he saw DrGioffre 7/12 w/ LBP into his left leg; XRays were neg; MRI was to be done by Ortho but we don't have f/u note & there is nothing in EPIC; pt notes that LBP is better w/ OTC meds...    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 We reviewed prob list, meds, xrays and labs> see below for updates >> he had the 2013 flu vaccine 9/13...  LABS 3/14:  FLP- at goals on Cres;  Chems- ok x BS=230 A1c=11.2;  CBC- ok w/ Hg=18.2 unchanged;  TSH=0.99;  Urine- neg for prot...  ~  December 08, 2012:  56mo ROV & last visit we increased Jerome Waller's DM meds to Metform500-2Bid & added Glim4mg Qam (his BS was 230. A1c was 11.2); he tells me he has been taking his meds regularly, doing fair w/ his low carb diet & exercising w/ work & yard work at home; [employer] are much improved w/ BS= 127, A1c= 7.0; he is congratulated & asked to keep up the good work...     BP stable on diet alone & reads 120/84 today;  His weight is actually up 13# to 188# & we reviewed diet, exercise & weight goals;  As a fringe benefit- his Hg is improved down to 17.1 & he is encouraged to quit all smoking...           Problem List:  ALLERGIC RHINITIS (ICD-477.9) - mild symptoms... controlled w/ OTC Claritin or Zyrtek...  CIGARETTE SMOKER (ICD-305.1) - still smokes 1/4-1/2 ppd despite all efforts to get him to quit... he tried  and failed Chantix in the past...  ~  CXR 1/09 showed heart normal size,  clear lungs, NAD... ~  1/10:  he tells me that he quit smoking 2 days ago, did it on his own!!! ~  5/10:  he tells me now that he's smoking again, < 1/2 ppd... encouraged to quit!!! ~  8/10:  back to 1/2 ppd and he feels this may be the best he can do. ~  2/11-2/12:  down to 1/4 ppd now but can't seem to do any better; offered smoking cessation counselling etc. ~  3/13-6/13:  Still smoking & must quit completely esp in light of his elev Hg=18.0 ~  9/13:  He states decr smoking to "one cig every now & again" but still is not motivated to quit... ~  3/14: still smoking regularly & admits to <1/2ppd he says; denies cough, sput, hemoptysis, SOB, CP, etc; he again declines smoking cessation help.  VARICOSE VEINS >> he has VV and trophic changes of Ven Insuffic in LEs without edema; we discussed the use of support hose- try 20-1mm compression...  HYPERLIPIDEMIA (ICD-272.4) - hx Chol >300 in the past...  ~  FLP 12/07 on Lip40 showed TChol 176, TG 101, HDL 44, LDL 111 ~  FLP 1/09 on Lip40 showed TChol 209, TG 148, HDL 46, LDL 131... rec- change to Simva80. ~  FLP 1/10 ?on Simva80? showed TChol 319, TG 726, HDL 53, LDL 105... rec- new Rx for DM. ~  FLP 5/10 on Simva80 showed TChol 192, TG 83, HDL 48, LDL 128... same med, better diet. ~  FLP 2/11 on Simva80 showed TChol 186, TG 75, HDL 49, LDL 122 ~  FLP 8/11 on Simva80 showed TChol 208, TG 111, HDL 43, LDL 144... rec ch to Lipitor40 (he never did). ~  FLP 2/12 on Simva80 showed TChol 204, TG 120, HDL 45, LDL 139... rec ch to Lip40 now. ~  FLP 6/12 on Lip40 showed TChol 152, TG 126, HDL 45, LDL 82... Continue Lip40 + diet & exerc. ~  FLP 3/13 on Lip40 showed TChol 178, TG 89, HDL 51, LDL 109 ~  4/13:  Pt decided that the Lipitor caused his DM & he wants off this med; switched to CRESTOR 20mg /d. ~  FLP 9/13 on Cres20 showed TChol 199, TG 120, HDL 48, LDL 127... Continue Cres20,  better low chol diet. ~  FLP 3/14 on Cres20 showed TChol 173, TG 91, HDL, 55, LDL 100  DIABETES MELLITUS (ICD-250.00) - new Dx [name]> started on Metformin & Glimepiride;  His treatment has been complicated by lack of pt compliance & freq med changes on his own...  ~  labs 1/10 showed BS= 386, HgA1c= 14.6.Marland Kitchen. rec> started Metformin1000Bid + diet/ exerc. ~  labs 2/10 showed BS= 107, HgA1c= 10.7.Marland Kitchen. rec> add Glimep4mg /d. ~  labs 5/10 showed BS= 107, HgA1c= 5.5.Marland Kitchen. rec> decr Glimep4 to 1/2 tabQam. ~  labs 8/10 showed BS= 100, A1c= 5.4.Marland Kitchen. rec> stop the Glimep, & taper Metform. ~  labs 2/11 showed BS= 114, A1c= 6.3.Marland KitchenMarland Kitchen continue diet + exercise. ~  labs 8/11 showed BS= 100, A1c= 6.2 ~  labs 2/12 (back on Metform) showed BS= 168, A1c= 9.2.Marland Kitchen. rec> start Metform500Bid +diet, +water. ~  Labs 6/12 on Metform250Bid showed BS= 138, A1c= 6.8.Marland KitchenMarland Kitchen OK to continue same dose + diet/ exerc. ~  6/12:  Family requested Endocrine consult w/ DrEllison- his notes are reviewed & pt switched to MetformER500Bid. ~  9/12: A1c= 7.2 & Actos 45mg /d added (?he never filled it) & ?insurance changed to Januvia 100mg /d but he didn't stick w/ it. ~  Labs 3/13 on Metform500Bid showed BS=261, A1c=11.0; REC> incr Metform500-2Bid, add Glimep4mg /d... ~  6/13:  Pt decided it was the Lipitor that caused his DM; stopped Lip40 & changed to Cres20; he stopped his Metform&Glimep and follow up labs showed BS= 125, A1c= 6.8==> we discussed checking BS freq at home & taking Metformin 500mg  if BS>150, he likes this idea...  ~  9/13:  Pt taking Metform500mg  "as needed" & claims that BS at home has mostly been ~100; Labs showed BS=193, A1c=8.8 indicating poor control once again; he is asked to take Metformin 500mg Bid regularly + diet & exercise... ~  Labs 3/14 on Metform500Bid showed BS= 230, A1c= 11.2.Marland KitchenMarland Kitchen Rec- incr Metform500-2Bid & add Glimep4mg /d... ~  6/14: on Metform500-2Bid, Glim4;  Labs show BS=127, A1c=7.0.Marland KitchenMarland Kitchen Keep up the good work...  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  LBP >> he went to [facility], DrGioffre, for LBP & radiation to left leg; XRays were reported normal; MRI was ordered but we do not have the reusults & did not receive a follow up note...   WRIST PAIN (ICD-719.43) - eval by DrKuzma w/ MRI showing scapholunate ligament disruption... Rx splint, Mobic, consideration of surgery.  Hx of HEADACHE (ICD-784.0)  ELEVATED HEMOGLOBIN >> c/w erythrocytosis... ~  Labs 1/10 showed Hg= 17.1 ~  Labs 2/11 showed Hg= 17.5 ~  Labs in 2012 showed Hg= 17.4 to 18.0 ~  Labs 3/13 showed Hg= 18.0, other parameters are wnl... ~  Labs 6/13 showed Hg= 18.3 and we decided to refer pt to HEMATOLOGY for further eval. ~  7/13:  Seen by DrShadad for Heme> he felt that elev Hg likely due to secondary causes- advised quit smoking, drink plenty of water, etc... ~  Labs 9/13 showed Hg= 18.4 & blood is too thick, must quit smoking, increase water intake, DrShadad didn't feel he needed further work up. ~  Labs 3/14 showed Hg= 18.2, unchanged & he is again asked to quit all the smoking... ~  Labs 6/14 showed Hg= 17.1  Health Maintenance:  rec to take ASA 81mg /d... ~  GI:  Age 35- no hx GI problems, hematochezia, etc... ~  GU:  He is established w/ DrOttelin due to condyloma... ~  Immunizations:  he is rec to get the yearly Flu vaccines...    Past Surgical History  Procedure Laterality Date  . Mandible surgery  1988    for underbite    Outpatient Encounter Prescriptions as of 12/08/2012  Medication Sig Dispense Refill  . aspirin 81 MG tablet Take 81 mg by mouth daily.        Marland Kitchen glimepiride (AMARYL) 4 MG tablet Take 1 tablet (4 mg total) by mouth daily before breakfast.  30 tablet  6  . glucose blood (BAYER CONTOUR TEST) test strip Use as instructed to check blood sugar  100 each  2  . metFORMIN (GLUCOPHAGE) 500 MG tablet Take 2 tablets (1,000  mg total) by mouth 2 (two) times daily with a meal.  120 tablet  6  . Multiple Vitamin (MULTIVITAMIN) capsule Take 1 capsule by mouth daily.        . Omega-3 Fatty Acids (FISH OIL) 1200 MG CAPS Take 1 capsule by mouth daily.        . rosuvastatin (CRESTOR) 20 MG tablet Take 1 tablet (20 mg total) by mouth daily.  30 tablet  6   No facility-administered encounter medications on file  as of 12/08/2012.    Allergies ==> No Known Drug Allergies...   Current Medications, Allergies, Past Medical History, Past Surgical History, Family History, and Social History were reviewed in [employer] record.    Review of Systems        See HPI - all other systems neg except as noted... The patient denies anorexia, fever, weight loss, weight gain, vision loss, decreased hearing, hoarseness, chest pain, syncope, dyspnea on exertion, peripheral edema, prolonged cough, headaches, hemoptysis, abdominal pain, melena, hematochezia, severe indigestion/heartburn, hematuria, incontinence, muscle weakness, suspicious skin lesions, transient blindness, difficulty walking, depression, unusual weight change, abnormal bleeding, enlarged lymph nodes, and angioedema.    Objective:   Physical Exam      WD, WN, 47 y/o WM in NAD... GENERAL:  Alert & oriented; pleasant & cooperative... HEENT:  [place]/AT, EOM-wnl, PERRLA, EACs-clear, TMs-wnl, NOSE-clear, THROAT-clear & wnl. NECK:  Supple w/ full ROM; no JVD; normal carotid impulses w/o bruits; no thyromegaly or nodules palpated; no lymphadenopathy. CHEST:  Clear to P & A; without wheezes/ rales/ or rhonchi. HEART:  Regular Rhythm; without murmurs/ rubs/ or gallops. ABDOMEN:  Soft & nontender; normal bowel sounds; no organomegaly or masses detected. EXT: without deformities, mild arthritic change in R wrist; +varicose veins/ +venous insuffic/ no edema. NEURO:  CN's intact, no focal neuro deficits... SKIN:  WNL, no lesions seen...  RADIOLOGY DATA:  Reviewed  in the EPIC EMR & discussed w/ the patient...  LABORATORY DATA:  Reviewed in the EPIC EMR & discussed w/ the patient...   Assessment & Plan:    SMOKER>  We discussed smoking cessation strategies but he is not motivated to quit; he understands the relationship of smoking to his "thick blood" & poor blood flow consequences...  Varicose Veins>  Rx written for compression stockings (try 20-23mm Hg compression)...  HYPERLIPIDEMIA>  FLP was much improved on Lip40 + his diet/ exercise/ etc; but he decided (2013) that the Lipitor caused his DM & refused this med; switched to Cres20 & f/u FLP is at the goals now...  DM>  on Metform500-2Bid & Glimep4mg  Qam> Labs much improved w/ BS=127, A1c=7.0.Marland KitchenMarland Kitchen  LBP>  He was eval by [facility] 7/12...  Elev Hemoglobin>  Hg was 18.3 & we referred him for Heme consult (consider EPO level & JAK mutation genetics); seen by DrShadad who doubted signif prob here, didn't think he needed further eval, & rec to improve poss secondary causes- rec to quit smoking, stay well hydrated... 9/13> Hg=18.4; 3/14> Hg= 18.2; 6/14> Hg=17.1  Other medical issues as noted...   Patient's Medications  New Prescriptions   No medications on file  Previous Medications   ASPIRIN 81 MG TABLET    Take 81 mg by mouth daily.     GLIMEPIRIDE (AMARYL) 4 MG TABLET    Take 1 tablet (4 mg total) by mouth daily before breakfast.   GLUCOSE BLOOD (BAYER CONTOUR TEST) TEST STRIP    Use as instructed to check blood sugar   METFORMIN (GLUCOPHAGE) 500 MG TABLET    Take 2 tablets (1,000 mg total) by mouth 2 (two) times daily with a meal.   MULTIPLE VITAMIN (MULTIVITAMIN) CAPSULE    Take 1 capsule by mouth daily.     OMEGA-3 FATTY ACIDS (FISH OIL) 1200 MG CAPS    Take 1 capsule by mouth daily.     ROSUVASTATIN (CRESTOR) 20 MG TABLET    Take 1 tablet (20 mg total) by mouth daily.  Modified Medications   No medications  on file  Discontinued Medications   No medications on file

## 2013-03-02 ENCOUNTER — Ambulatory Visit: Payer: BC Managed Care – PPO | Admitting: Pulmonary Disease

## 2013-04-16 ENCOUNTER — Other Ambulatory Visit: Payer: Self-pay | Admitting: Pulmonary Disease

## 2013-04-19 ENCOUNTER — Other Ambulatory Visit: Payer: Self-pay | Admitting: Pulmonary Disease

## 2013-04-27 ENCOUNTER — Ambulatory Visit (INDEPENDENT_AMBULATORY_CARE_PROVIDER_SITE_OTHER): Payer: 59

## 2013-04-27 DIAGNOSIS — Z23 Encounter for immunization: Secondary | ICD-10-CM

## 2013-05-31 ENCOUNTER — Telehealth: Payer: Self-pay | Admitting: Pulmonary Disease

## 2013-05-31 DIAGNOSIS — E119 Type 2 diabetes mellitus without complications: Secondary | ICD-10-CM

## 2013-05-31 NOTE — Telephone Encounter (Signed)
SN please advise if pt will need full fasting labs.  These were last done 08/2012.  Pt had bmp, cbcd, a1c in June.  Pt has pending appt with SN on 12/22.  Thanks  No Known Allergies    Current Outpatient Prescriptions on File Prior to Visit  Medication Sig Dispense Refill  . aspirin 81 MG tablet Take 81 mg by mouth daily.        . CRESTOR 20 MG tablet TAKE 1 TABLET EVERY DAY  30 tablet  6  . glimepiride (AMARYL) 4 MG tablet TAKE 1 TABLET EVERY DAY BEFORE MEALS BREAKFAST  30 tablet  6  . glucose blood (BAYER CONTOUR TEST) test strip Use as instructed to check blood sugar  100 each  2  . metFORMIN (GLUCOPHAGE) 500 MG tablet TAKE 2 TABLETS TWICE DAILY WITH A MEAL  120 tablet  6  . Multiple Vitamin (MULTIVITAMIN) capsule Take 1 capsule by mouth daily.        . Omega-3 Fatty Acids (FISH OIL) 1200 MG CAPS Take 1 capsule by mouth daily.        . [DISCONTINUED] sitaGLIPtin (JANUVIA) 100 MG tablet Take 1 tablet (100 mg total) by mouth daily.  30 tablet  11   No current facility-administered medications on file prior to visit.

## 2013-06-01 ENCOUNTER — Other Ambulatory Visit (INDEPENDENT_AMBULATORY_CARE_PROVIDER_SITE_OTHER): Payer: 59

## 2013-06-01 DIAGNOSIS — E119 Type 2 diabetes mellitus without complications: Secondary | ICD-10-CM

## 2013-06-01 LAB — BASIC METABOLIC PANEL
BUN: 14 mg/dL (ref 6–23)
CO2: 27 mEq/L (ref 19–32)
Calcium: 9.4 mg/dL (ref 8.4–10.5)
Glucose, Bld: 167 mg/dL — ABNORMAL HIGH (ref 70–99)
Potassium: 4.4 mEq/L (ref 3.5–5.1)
Sodium: 139 mEq/L (ref 135–145)

## 2013-06-01 NOTE — Telephone Encounter (Signed)
Order have been placed in the computer.  Pt will not be due for full labs until after the first of the year

## 2013-06-04 ENCOUNTER — Ambulatory Visit (INDEPENDENT_AMBULATORY_CARE_PROVIDER_SITE_OTHER): Payer: 59 | Admitting: Pulmonary Disease

## 2013-06-04 ENCOUNTER — Encounter: Payer: Self-pay | Admitting: Pulmonary Disease

## 2013-06-04 VITALS — BP 144/80 | HR 92 | Temp 98.2°F | Ht 72.0 in | Wt 197.2 lb

## 2013-06-04 DIAGNOSIS — F17200 Nicotine dependence, unspecified, uncomplicated: Secondary | ICD-10-CM

## 2013-06-04 DIAGNOSIS — D751 Secondary polycythemia: Secondary | ICD-10-CM

## 2013-06-04 DIAGNOSIS — E785 Hyperlipidemia, unspecified: Secondary | ICD-10-CM

## 2013-06-04 DIAGNOSIS — E119 Type 2 diabetes mellitus without complications: Secondary | ICD-10-CM

## 2013-06-04 NOTE — Progress Notes (Signed)
Subjective:    Patient ID: Jerome Waller, male    DOB: [birth year], 47 y.o.   MRN: [id number]  HPI 47 y/o WM here for a follow up visit... he has multiple medical problems as noted below...    ~  SEE PREV EPIC ENTRIES FOR OLD DATA FROM 2010 - 2013...  ~  August 20, 2011:  59mo ROV & he has been under the care of DrEllison- currently on Metform500Bid & he stopped the Januvia100 (?why);  He had one interval lab in EPIC- A1c done 9/12 was 7.2;  Unfortunately current labs are once again horrible w/ FBS=261, A1c=11.0 and he doesn't have a clue what happened; he thinks elev BS is related to OTC "cold" meds he is currently taking; weight is 177# which is down 10# from our last visit...  Chol is fair on Lip40...  Note too that hg is elev at 89 & he still smokes ==> we reviewed all these issues w/ the following plan:      Smoking> may partly be the etiology of his elev blood count; must quit all smoking, incr fluid intake & we will f/u CBC, if Hg still elev then we will refer to Heme for further check (check EPO level & JAK genetic marker)...    Chol> on Lip40 but the LDL is still not at goal; needs better diet, same med for now...    DM> maximize the Metform500-2Bid, add Glimep4mg Qam, get on diet & incr fluids; we discussed next step on Gliptin/ Thiazolid pills vs Insulin Rx...    LBP> he saw DrGioffre 7/12 w/ LBP into his left leg; XRays were neg; MRI was to be done by Ortho but we don't have f/u note & there is nothing in EPIC; pt notes that LBP is better w/ OTC meds... LABS 3/13:  FLP- at goals x LDL=109;  Chems- wnl x BS=261 A1c=11.0;  CBC- Hg=18.0;  Umicroalb- neg...  ~  November 26, 2011:  260mo ROV & Jerome Waller has once again stopped all his meds on his own> he says they figured out that his worsening BS occurred when he switched to Lipitor40 & he blamed that med for the A1c=11.0 at last OV;  He called & we changed him to Crestor 20mg /d;  He stopped his Metformin & Glimepiride on his own & says that his BS has  been 120-200 at home==> we discussed all this & suggested to him that to prevent the kind of trouble he had prev (A1c 11-14) that he check his BS often & plan to take Metformin 500mg  1-2 per day if his BS is >150 range (he will try to comply he says)...     He still smokes ~1/2ppd and CBC is worse w/ Hg= 18.3, so at this point I have requested consultation from Hematology to r/o PVera etc (will leave it to Heme to check his EPO level & JAK genetic marker)... LABS 6/13:  BMet- ok x BS=125 A1c=6.8;  CBC- Hg=18.3 & we will refer to HEME...  ~  March 03, 2012:  260mo ROV & Jerome Waller claims his BSs are "great- most around 100" & he has only been taking the Metformin intermittently if BS>250 he says (we reviewed prev recs & he was asked to check BS freq & take Metform500mg  1-2 daily for BS>150 as he will not take the med regularly); he is due for BMet & A1c- pending...     He has been on Cres20 & due for f/u FLP on this med- pending (  recall that he blaims his DM in his prev Lipitor40)...    He saw DrShadad for Erythrocytosis7/13> Hg was 18.3 when referred & DrShadad felt it was likely to be secondary & pt again asked to stop smoking etc; he did not think this was PVera & did not think he needed further eval at this time...    We reviewed prob list, meds, xrays and labs> see below for updates >> he will get Flu shot at work, he says... LABS 9/13:  FLP- ok x LDL=127 on Cres20;  Chems- ok x BS=193 A1c=8.8;  CBC- Hg=18.4...  ~  September 01, 2012:  17mo ROV & Jerome Waller reports that he is doing well- feeling good, no new complaints or concerns;  We reviewed the following medical problems during today's office visit >>     Smoking> still smoking regularly & admits to <1/2ppd he says; denies cough, sput, hemoptysis, SOB, CP, etc; he again declines smoking cessation help...    Varicose Veins> he has VV & venous insuffic changes but no edema; he requests support hose Rx/ TEDs just like a co-worker; Rec 20-74mmHg compression...     Chol> on Cres20 & is taking it regularly he says (confirmed w/ [facility]); FLP shows TChol 173, TG 91, HDL, 55, LDL 100...    DM> on Metform500Bid & taking regularly confirmed by Pharm; NOT checking BS at home ("mother-in-law took my meter"); Labs show BS=230, A1c=11.2, & rec to incr Metform to 1000mg Bid & add Glimep4mg  Qam... ROV 105mo...    LBP> he saw DrGioffre 7/12 w/ LBP into his left leg; XRays were neg; MRI was to be done by Ortho but we don't have f/u note & there is nothing in EPIC; pt notes that LBP is better w/ OTC meds...    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 We reviewed prob list, meds, xrays and labs> see below for updates >> he had the 2013 flu vaccine 9/13...  LABS 3/14:  FLP- at goals on Cres;  Chems- ok x BS=230 A1c=11.2;  CBC- ok w/ Hg=18.2 unchanged;  TSH=0.99;  Urine- neg for prot...  ~  December 08, 2012:  105mo ROV & last visit we increased Jerome Waller's DM meds to Metform500-2Bid & added Glim4mg Qam (his BS was 230. A1c was 11.2); he tells me he has been taking his meds regularly, doing fair w/ his low carb diet & exercising w/ work & yard work at home; [employer] are much improved w/ BS= 127, A1c= 7.0; he is congratulated & asked to keep up the good work...     BP stable on diet alone & reads 120/84 today;  His weight is actually up 13# to 188# & we reviewed diet, exercise & weight goals;  As a fringe benefit- his Hg is improved down to 17.1 & he is encouraged to quit all smoking...   ~  June 04, 2013:  17mo ROV & Jerome Waller indicates that his accuchecks have been good, despite a 9# wt gain; feels well- no new complaints or concerns...    He is still smoking but states <1/2ppd now; he declines smoking  cessation help and asked to try Nicoderm patches, gum, lozenges, etc..Marland Kitchen  He remains on Cres20 for his Chol; FLP 3/14 was at goals but concerned for recent wt gain (up 9# to 197# today); we reviewed low carb, low fat diet...    Labs today showed BS= 167, A1c= 6.8 on his Metformin500-2Bid & Glimep4mg  Qam; Rec to keep same meds, take them regularly, get wt down.. We reviewed prob list, meds, xrays and labs> see below for updates >> he had the 2014 Flu vaccine in Nov...          Problem List:  ALLERGIC RHINITIS (ICD-477.9) - mild symptoms... controlled w/ OTC Claritin or Zyrtek...  CIGARETTE SMOKER (ICD-305.1) - still smokes 1/4-1/2 ppd despite all efforts to get him to quit... he tried and failed Chantix in the past...  ~  CXR 1/09 showed heart normal size, clear lungs, NAD... ~  1/10:  he tells me that he quit smoking 2 days ago, did it on his own!!! ~  5/10:  he tells me now that he's smoking again, < 1/2 ppd... encouraged to quit!!! ~  8/10:  back to 1/2 ppd and he feels this may be the best he can do. ~  2/11-2/12:  down to 1/4 ppd now but can't seem to do any better; offered smoking cessation counselling etc. ~  3/13-6/13:  Still smoking & must quit completely esp in light of his elev Hg=18.0 ~  9/13:  He states decr smoking to "one cig every now & again" but still is not motivated to quit... ~  3/14: still smoking regularly & admits to <1/2ppd he says; denies cough, sput, hemoptysis, SOB, CP, etc; he again declines smoking cessation help. ~  12/14:  Admits to <1/2ppd & he is again asked to quit & offered smoking cessation help which he again declines...  VARICOSE VEINS >> he has VV and trophic changes of Ven Insuffic in LEs without edema; we discussed the use of support hose- try 20-85mm compression...  HYPERLIPIDEMIA (ICD-272.4) - hx Chol >300 in the past...  ~  FLP 12/07 on Lip40 showed TChol 176, TG 101, HDL 44, LDL 111 ~  FLP 1/09 on Lip40 showed TChol 209, TG 148, HDL 46, LDL  131... rec- change to Simva80. ~  FLP 1/10 ?on Simva80? showed TChol 319, TG 726, HDL 53, LDL 105... rec- new Rx for DM. ~  FLP 5/10 on Simva80 showed TChol 192, TG 83, HDL 48, LDL 128... same med, better diet. ~  FLP 2/11 on Simva80 showed TChol 186, TG 75, HDL 49, LDL 122 ~  FLP 8/11 on Simva80 showed TChol 208, TG 111, HDL 43, LDL 144... rec ch to Lipitor40 (he never did). ~  FLP 2/12 on Simva80 showed TChol 204, TG 120, HDL 45, LDL 139... rec ch to Lip40 now. ~  FLP 6/12 on Lip40 showed TChol 152, TG 126, HDL 45, LDL 82... Continue Lip40 + diet & exerc. ~  FLP 3/13 on Lip40 showed TChol 178, TG 89, HDL 51, LDL 109 ~  4/13:  Pt decided that the Lipitor caused his DM & he wants off this med; switched to CRESTOR 20mg /d. ~  FLP 9/13 on Cres20 showed TChol 199, TG 120, HDL 48, LDL 127... Continue Cres20, better low chol diet. ~  FLP 3/14 on Cres20 showed TChol 173, TG 91, HDL, 55, LDL 100  DIABETES MELLITUS (ICD-250.00) - new Dx [name]> started on Metformin & Glimepiride;  His treatment has been complicated by lack of pt compliance & freq med changes on his own...  ~  labs 1/10 showed BS= 386, HgA1c= 14.6.Marland Kitchen. rec> started Metformin1000Bid + diet/ exerc. ~  labs 2/10 showed BS= 107, HgA1c= 10.7.Marland Kitchen. rec> add Glimep4mg /d. ~  labs 5/10 showed BS= 107, HgA1c= 5.5.Marland Kitchen. rec> decr Glimep4 to 1/2 tabQam. ~  labs 8/10 showed BS= 100, A1c= 5.4.Marland Kitchen. rec> stop the Glimep, & taper Metform. ~  labs 2/11 showed BS= 114, A1c= 6.3.Marland KitchenMarland Kitchen continue diet + exercise. ~  labs 8/11 showed BS= 100, A1c= 6.2 ~  labs 2/12 (back on Metform) showed BS= 168, A1c= 9.2.Marland Kitchen. rec> start Metform500Bid +diet, +water. ~  Labs 6/12 on Metform250Bid showed BS= 138, A1c= 6.8.Marland KitchenMarland Kitchen OK to continue same dose + diet/ exerc. ~  6/12:  Family requested Endocrine consult w/ DrEllison- his notes are reviewed & pt switched to MetformER500Bid. ~  9/12: A1c= 7.2 & Actos 45mg /d added (?he never filled it) & ?insurance changed to Januvia 100mg /d but he didn't  stick w/ it. ~  Labs 3/13 on Metform500Bid showed BS=261, A1c=11.0; REC> incr Metform500-2Bid, add Glimep4mg /d... ~  6/13:  Pt decided it was the Lipitor that caused his DM; stopped Lip40 & changed to Cres20; he stopped his Metform&Glimep and follow up labs showed BS= 125, A1c= 6.8==> we discussed checking BS freq at home & taking Metformin 500mg  if BS>150, he likes this idea...  ~  9/13:  Pt taking Metform500mg  "as needed" & claims that BS at home has mostly been ~100; Labs showed BS=193, A1c=8.8 indicating poor control once again; he is asked to take Metformin 500mg Bid regularly + diet & exercise... ~  Labs 3/14 on Metform500Bid showed BS= 230, A1c= 11.2.Marland KitchenMarland Kitchen Rec- incr Metform500-2Bid & add Glimep4mg /d... ~  6/14: on Metform500-2Bid, Glim4;  Labs show BS=127, A1c=7.0.Marland KitchenMarland Kitchen Keep up the good work... 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  LBP >> he went to [facility], DrGioffre, for LBP & radiation to left leg; XRays were reported normal; MRI was ordered but we do not have the reusults & did not receive a follow up note...   WRIST PAIN (ICD-719.43) - eval by DrKuzma w/ MRI showing scapholunate ligament disruption... Rx splint, Mobic, consideration of surgery.  Hx of HEADACHE (ICD-784.0)  ELEVATED HEMOGLOBIN >> c/w erythrocytosis... ~  Labs 1/10 showed Hg= 17.1 ~  Labs 2/11 showed Hg= 17.5 ~  Labs in 2012 showed Hg= 17.4 to 18.0 ~  Labs 3/13 showed Hg= 18.0, other parameters are wnl... ~  Labs 6/13 showed Hg= 18.3 and we decided to refer pt to HEMATOLOGY for further eval. ~  7/13:  Seen by DrShadad for Heme> he felt that elev Hg likely due to secondary causes- advised quit smoking, drink plenty of water, etc... ~  Labs 9/13 showed Hg= 18.4 & blood is too thick, must quit smoking, increase water intake, DrShadad didn't feel he needed  further work up. ~  Labs 3/14 showed Hg= 18.2, unchanged & he is again asked to quit all the smoking... ~  Labs 6/14 showed Hg= 17.1  Health Maintenance:  rec to take ASA 81mg /d... ~  GI:  Age 42- no hx GI problems, hematochezia, etc... ~  GU:  He is established w/ DrOttelin due to condyloma... ~  Immunizations:  he is rec to get the yearly Flu vaccines...    Past Surgical History  Procedure Laterality Date  . Mandible surgery  1988    for underbite  Outpatient Encounter Prescriptions as of 06/04/2013  Medication Sig  . aspirin 81 MG tablet Take 81 mg by mouth daily.    . CRESTOR 20 MG tablet TAKE 1 TABLET EVERY DAY  . glimepiride (AMARYL) 4 MG tablet TAKE 1 TABLET EVERY DAY BEFORE MEALS BREAKFAST  . glucose blood (BAYER CONTOUR TEST) test strip Use as instructed to check blood sugar  . metFORMIN (GLUCOPHAGE) 500 MG tablet TAKE 2 TABLETS TWICE DAILY WITH A MEAL  . Multiple Vitamin (MULTIVITAMIN) capsule Take 1 capsule by mouth daily.    . Omega-3 Fatty Acids (FISH OIL) 1200 MG CAPS Take 1 capsule by mouth daily.      Allergies ==> No Known Drug Allergies...   Current Medications, Allergies, Past Medical History, Past Surgical History, Family History, and Social History were reviewed in Jerome Waller record.    Review of Systems        See HPI - all other systems neg except as noted... The patient denies anorexia, fever, weight loss, weight gain, vision loss, decreased hearing, hoarseness, chest pain, syncope, dyspnea on exertion, peripheral edema, prolonged cough, headaches, hemoptysis, abdominal pain, melena, hematochezia, severe indigestion/heartburn, hematuria, incontinence, muscle weakness, suspicious skin lesions, transient blindness, difficulty walking, depression, unusual weight change, abnormal bleeding, enlarged lymph nodes, and angioedema.    Objective:   Physical Exam      WD, WN, 47 y/o WM in NAD... GENERAL:  Alert & oriented; pleasant &  cooperative... HEENT:  Jerome Waller/AT, EOM-wnl, PERRLA, EACs-clear, TMs-wnl, NOSE-clear, THROAT-clear & wnl. NECK:  Supple w/ full ROM; no JVD; normal carotid impulses w/o bruits; no thyromegaly or nodules palpated; no lymphadenopathy. CHEST:  Clear to P & A; without wheezes/ rales/ or rhonchi. HEART:  Regular Rhythm; without murmurs/ rubs/ or gallops. ABDOMEN:  Soft & nontender; normal bowel sounds; no organomegaly or masses detected. EXT: without deformities, mild arthritic change in R wrist; +varicose veins/ +venous insuffic/ no edema. NEURO:  CN's intact, no focal neuro deficits... SKIN:  WNL, no lesions seen...  RADIOLOGY DATA:  Reviewed in the EPIC EMR & discussed w/ the patient...  LABORATORY DATA:  Reviewed in the EPIC EMR & discussed w/ the patient...   Assessment & Plan:    SMOKER>  We discussed smoking cessation strategies but he is not motivated to quit; he understands the relationship of smoking to his "thick blood" & poor blood flow consequences...  Varicose Veins>  Rx written for compression stockings (try 20-12mm Hg compression)...  HYPERLIPIDEMIA>  FLP was much improved on Lip40 + his diet/ exercise/ etc; but he decided (2013) that the Lipitor caused his DM & refused this med; switched to Cres20 & f/u FLP is at the goals now...  DM>  on Metform500-2Bid & Glimep4mg  Qam> Labs much improved w/ BS=167, A1c=6.8.Marland KitchenMarland Kitchen  LBP>  He was eval by [facility] 7/12...  Elev Hemoglobin>  Hg was 18.3 & we referred him for Heme consult (consider EPO level & JAK mutation genetics); seen by DrShadad who doubted signif prob here, didn't think he needed further eval, & rec to improve poss secondary causes- rec to quit smoking, stay well hydrated... 9/13> Hg=18.4; 3/14> Hg= 18.2; 6/14> Hg=17.1  Other medical issues as noted...   Patient's Medications  New Prescriptions   No medications on file  Previous Medications   ASPIRIN 81 MG TABLET    Take 81 mg by mouth daily.     CRESTOR 20 MG TABLET     TAKE 1 TABLET EVERY DAY   GLIMEPIRIDE (AMARYL) 4  MG TABLET    TAKE 1 TABLET EVERY DAY BEFORE MEALS BREAKFAST   GLUCOSE BLOOD (BAYER CONTOUR TEST) TEST STRIP    Use as instructed to check blood sugar   METFORMIN (GLUCOPHAGE) 500 MG TABLET    TAKE 2 TABLETS TWICE DAILY WITH A MEAL   MULTIPLE VITAMIN (MULTIVITAMIN) CAPSULE    Take 1 capsule by mouth daily.     OMEGA-3 FATTY ACIDS (FISH OIL) 1200 MG CAPS    Take 1 capsule by mouth daily.    Modified Medications   No medications on file  Discontinued Medications   No medications on file

## 2013-06-04 NOTE — Patient Instructions (Signed)
Today we updated your med list in our EPIC system...    Continue your current medications the same...  Your DM labs look good> continue the same meds regularly, and your low carb/ diabetic diet...    Work on weight reduction...  Call for any questions...  Let's plan a follow up visit in 80mo, sooner if needed for problems.Marland KitchenMarland Kitchen

## 2013-08-13 ENCOUNTER — Telehealth: Payer: Self-pay | Admitting: Pulmonary Disease

## 2013-08-13 MED ORDER — AZITHROMYCIN 250 MG PO TABS: ORAL_TABLET | ORAL | Status: DC

## 2013-08-13 MED ORDER — PREDNISONE (PAK) 5 MG PO TABS: ORAL_TABLET | ORAL | Status: DC

## 2013-08-13 NOTE — Telephone Encounter (Signed)
Per SN----  zpak #1  Take as directed pred dosepak #1  Take as directed  5 mg   6 day pack

## 2013-08-13 NOTE — Telephone Encounter (Signed)
Called, spoke with pt's wife - On Wednesday night and Thursday, pt started sneezing.  He now has sinus pressure, head congestion with brown tinged mucus, nonprod cough, sore throat, and PND. Temp 99 yesterday.  No wheezing or chest tightness.  Started on mucinex yesterday.  Requesting zpak and a steroid.  Dr. Lenna Gilford, pls advise.  Thank you.  [facility] [address]  [facility] - verified with wife  Last OV with SN: 06/04/13; f/u in 6 months

## 2013-08-13 NOTE — Telephone Encounter (Signed)
Called, spoke with pt's wife.  Informed her of below recs per SN.  She verbalized understanding and will inform pt, is aware [facility], and is to have pt call back or seek emergency care if symptoms do not improve or worsen.

## 2013-08-27 ENCOUNTER — Telehealth: Payer: Self-pay | Admitting: Pulmonary Disease

## 2013-08-27 ENCOUNTER — Other Ambulatory Visit: Payer: Self-pay | Admitting: Pulmonary Disease

## 2013-08-27 DIAGNOSIS — M545 Low back pain, unspecified: Secondary | ICD-10-CM

## 2013-08-27 DIAGNOSIS — J309 Allergic rhinitis, unspecified: Secondary | ICD-10-CM

## 2013-08-27 DIAGNOSIS — D582 Other hemoglobinopathies: Secondary | ICD-10-CM

## 2013-08-27 DIAGNOSIS — E785 Hyperlipidemia, unspecified: Secondary | ICD-10-CM

## 2013-08-27 DIAGNOSIS — E119 Type 2 diabetes mellitus without complications: Secondary | ICD-10-CM

## 2013-08-27 NOTE — Telephone Encounter (Signed)
Per the pt's wife's request, I have made an appointment with Dr. Jenny Reichmann to take over primary care. This appointment has been made for 11/09/13 at 2:30pm. Pt's wife is aware. Nothing further is needed.

## 2013-09-13 ENCOUNTER — Other Ambulatory Visit: Payer: Self-pay | Admitting: *Deleted

## 2013-09-13 MED ORDER — METFORMIN HCL 500 MG PO TABS: 1000.0000 mg | ORAL_TABLET | Freq: Two times a day (BID) | ORAL | Status: DC

## 2013-11-09 ENCOUNTER — Ambulatory Visit (INDEPENDENT_AMBULATORY_CARE_PROVIDER_SITE_OTHER): Payer: 59

## 2013-11-09 ENCOUNTER — Ambulatory Visit (INDEPENDENT_AMBULATORY_CARE_PROVIDER_SITE_OTHER): Payer: 59 | Admitting: Internal Medicine

## 2013-11-09 ENCOUNTER — Encounter: Payer: Self-pay | Admitting: Internal Medicine

## 2013-11-09 VITALS — BP 122/80 | HR 89 | Temp 98.1°F | Ht 72.0 in | Wt 183.5 lb

## 2013-11-09 DIAGNOSIS — Z0000 Encounter for general adult medical examination without abnormal findings: Secondary | ICD-10-CM

## 2013-11-09 DIAGNOSIS — E119 Type 2 diabetes mellitus without complications: Secondary | ICD-10-CM

## 2013-11-09 DIAGNOSIS — E1165 Type 2 diabetes mellitus with hyperglycemia: Secondary | ICD-10-CM

## 2013-11-09 DIAGNOSIS — IMO0001 Reserved for inherently not codable concepts without codable children: Secondary | ICD-10-CM

## 2013-11-09 DIAGNOSIS — F17200 Nicotine dependence, unspecified, uncomplicated: Secondary | ICD-10-CM

## 2013-11-09 LAB — CBC WITH DIFFERENTIAL/PLATELET
BASOS ABS: 0 10*3/uL (ref 0.0–0.1)
Basophils Relative: 0.3 % (ref 0.0–3.0)
Eosinophils Absolute: 0.1 10*3/uL (ref 0.0–0.7)
Eosinophils Relative: 1.7 % (ref 0.0–5.0)
HCT: 48.4 % (ref 39.0–52.0)
HEMOGLOBIN: 17 g/dL (ref 13.0–17.0)
Lymphocytes Relative: 31.5 % (ref 12.0–46.0)
Lymphs Abs: 2.7 10*3/uL (ref 0.7–4.0)
MCHC: 35.2 g/dL (ref 30.0–36.0)
MCV: 94.2 fl (ref 78.0–100.0)
MONOS PCT: 5.9 % (ref 3.0–12.0)
Monocytes Absolute: 0.5 10*3/uL (ref 0.1–1.0)
NEUTROS PCT: 60.6 % (ref 43.0–77.0)
Neutro Abs: 5.2 10*3/uL (ref 1.4–7.7)
PLATELETS: 215 10*3/uL (ref 150.0–400.0)
RBC: 5.14 Mil/uL (ref 4.22–5.81)
RDW: 12.4 % (ref 11.5–15.5)
WBC: 8.5 10*3/uL (ref 4.0–10.5)

## 2013-11-09 LAB — BASIC METABOLIC PANEL
BUN: 14 mg/dL (ref 6–23)
BUN: 14 mg/dL (ref 6–23)
CALCIUM: 9.9 mg/dL (ref 8.4–10.5)
CO2: 24 meq/L (ref 19–32)
CO2: 27 mEq/L (ref 19–32)
Calcium: 9.7 mg/dL (ref 8.4–10.5)
Chloride: 102 mEq/L (ref 96–112)
Chloride: 104 mEq/L (ref 96–112)
Creatinine, Ser: 0.9 mg/dL (ref 0.4–1.5)
Creatinine, Ser: 0.9 mg/dL (ref 0.4–1.5)
GFR: 100.44 mL/min (ref >60.00)
GFR: 96.59 mL/min (ref >60.00)
GLUCOSE: 181 mg/dL — AB (ref 70–99)
Glucose, Bld: 179 mg/dL — ABNORMAL HIGH (ref 70–99)
POTASSIUM: 4 meq/L (ref 3.5–5.1)
Potassium: 4 mEq/L (ref 3.5–5.1)
SODIUM: 137 meq/L (ref 135–145)
Sodium: 136 mEq/L (ref 135–145)

## 2013-11-09 LAB — HEPATIC FUNCTION PANEL
ALBUMIN: 4.2 g/dL (ref 3.5–5.2)
ALK PHOS: 95 U/L (ref 39–117)
ALT: 37 U/L (ref 0–53)
AST: 24 U/L (ref 0–37)
BILIRUBIN TOTAL: 0.9 mg/dL (ref 0.2–1.2)
Bilirubin, Direct: 0.1 mg/dL (ref 0.0–0.3)
Total Protein: 6.8 g/dL (ref 6.0–8.3)

## 2013-11-09 LAB — LIPID PANEL
CHOLESTEROL: 177 mg/dL (ref 0–200)
HDL: 45.7 mg/dL (ref >39.00)
LDL Cholesterol: 80 mg/dL (ref 0–99)
Total CHOL/HDL Ratio: 4
Triglycerides: 258 mg/dL — ABNORMAL HIGH (ref 0.0–149.0)
VLDL: 51.6 mg/dL — AB (ref 0.0–40.0)

## 2013-11-09 LAB — TSH: TSH: 0.87 u[IU]/mL (ref 0.35–4.50)

## 2013-11-09 LAB — HEMOGLOBIN A1C: Hgb A1c MFr Bld: 9.5 % — ABNORMAL HIGH (ref 4.6–6.5)

## 2013-11-09 NOTE — Progress Notes (Signed)
Subjective:    Patient ID: Jerome Waller, male    DOB: 06/07/1966, 48 y.o.   MRN: [id number]  HPI  Here for wellness and f/u;  Overall doing ok;  Pt denies CP, worsening SOB, DOE, wheezing, orthopnea, PND, worsening LE edema, palpitations, dizziness or syncope.  Pt denies neurological change such as new headache, facial or extremity weakness.  Pt denies polydipsia, polyuria, or low sugar symptoms. Pt states overall good compliance with treatment and medications, good tolerability, and has been trying to follow lower cholesterol diet.  Pt denies worsening depressive symptoms, suicidal ideation or panic. No fever, night sweats, wt loss, loss of appetite, or other constitutional symptoms.  Pt states good ability with ADL's, has low fall risk, home safety reviewed and adequate, no other significant changes in hearing or vision, and only occasionally active with exercise. No current complaints Past Medical History  Diagnosis Date  . Allergic rhinitis   . Cigarette smoker   . Hyperlipidemia   . DM (diabetes mellitus)   . History of condyloma acuminatum   . Wrist pain   . History of headache    Past Surgical History  Procedure Laterality Date  . Mandible surgery  1988    for underbite    reports that he has been smoking Cigarettes.  He has a 10.5 pack-year smoking history. He has never used smokeless tobacco. He reports that he drinks alcohol. He reports that he does not use illicit drugs. family history includes Diabetes in his paternal grandfather; Hyperlipidemia in his brother, father, and sister. No Known Allergies Current Outpatient Prescriptions on File Prior to Visit  Medication Sig Dispense Refill  . aspirin 81 MG tablet Take 81 mg by mouth daily.        . CRESTOR 20 MG tablet TAKE 1 TABLET EVERY DAY  30 tablet  6  . glimepiride (AMARYL) 4 MG tablet TAKE 1 TABLET EVERY DAY BEFORE MEALS BREAKFAST  30 tablet  6  . glucose blood (BAYER CONTOUR TEST) test strip Use as instructed to check  blood sugar  100 each  2  . metFORMIN (GLUCOPHAGE) 500 MG tablet Take 2 tablets (1,000 mg total) by mouth 2 (two) times daily with a meal.  120 tablet  2  . Multiple Vitamin (MULTIVITAMIN) capsule Take 1 capsule by mouth daily.        . Omega-3 Fatty Acids (FISH OIL) 1200 MG CAPS Take 1 capsule by mouth daily.        . [DISCONTINUED] sitaGLIPtin (JANUVIA) 100 MG tablet Take 1 tablet (100 mg total) by mouth daily.  30 tablet  11   No current facility-administered medications on file prior to visit.   Review of Systems Constitutional: Negative for increased diaphoresis, other activity, appetite or other siginficant weight change  HENT: Negative for worsening hearing loss, ear pain, facial swelling, mouth sores and neck stiffness.   Eyes: Negative for other worsening pain, redness or visual disturbance.  Respiratory: Negative for shortness of breath and wheezing.   Cardiovascular: Negative for chest pain and palpitations.  Gastrointestinal: Negative for diarrhea, blood in stool, abdominal distention or other pain Genitourinary: Negative for hematuria, flank pain or change in urine volume.  Musculoskeletal: Negative for myalgias or other joint complaints.  Skin: Negative for color change and wound.  Neurological: Negative for syncope and numbness. other than noted Hematological: Negative for adenopathy. or other swelling Psychiatric/Behavioral: Negative for hallucinations, self-injury, decreased concentration or other worsening agitation.      Objective:  Physical Exam BP 122/80  Pulse 89  Temp(Src) 98.1 F (36.7 C) (Oral)  Ht 6' (1.829 m)  Wt 183 lb 8 oz (83.235 kg)  BMI 24.88 kg/m2  SpO2 97% VS noted,  Constitutional: Pt is oriented to person, place, and time. Appears well-developed and well-nourished.  Head: Normocephalic and atraumatic.  Right Ear: External ear normal.  Left Ear: External ear normal.  Nose: Nose normal.  Mouth/Throat: Oropharynx is clear and moist.  Eyes:  Conjunctivae and EOM are normal. Pupils are equal, round, and reactive to light.  Neck: Normal range of motion. Neck supple. No JVD present. No tracheal deviation present.  Cardiovascular: Normal rate, regular rhythm, normal heart sounds and intact distal pulses.   Pulmonary/Chest: Effort normal and breath sounds without rales or wheezing  Abdominal: Soft. Bowel sounds are normal. NT. No HSM  Musculoskeletal: Normal range of motion. Exhibits no edema.  Lymphadenopathy:  Has no cervical adenopathy.  Neurological: Pt is alert and oriented to person, place, and time. Pt has normal reflexes. No cranial nerve deficit. Motor grossly intact Skin: Skin is warm and dry. No rash noted.  Psychiatric:  Has normal mood and affect. Behavior is normal.     Assessment & Plan:

## 2013-11-09 NOTE — Assessment & Plan Note (Signed)
stable overall by history and exam, recent data reviewed with pt, and pt to continue medical treatment as before,  to f/u any worsening symptoms or concerns Lab Results  Component Value Date   HGBA1C 6.8* 06/01/2013

## 2013-11-09 NOTE — Progress Notes (Signed)
Pre visit review using our clinic review tool, if applicable. No additional management support is needed unless otherwise documented below in the visit note. 

## 2013-11-09 NOTE — Patient Instructions (Signed)
Please continue all other medications as before, and refills have been done if requested. Please have the pharmacy call with any other refills you may need.  Please continue your efforts at being more active, low cholesterol diet, and weight control.  You are otherwise up to date with prevention measures today.  Please go to the LAB in the Basement (turn left off the elevator) for the tests to be done today You will be contacted by phone if any changes need to be made immediately.  Otherwise, you will receive a letter about your results with an explanation, but please check with MyChart first.  Please remember to sign up for MyChart if you have not done so, as this will be important to you in the future with finding out test results, communicating by private email, and scheduling acute appointments online when needed.  Please return in 6 months, or sooner if needed, with Lab testing done 3-5 days before  

## 2013-11-09 NOTE — Assessment & Plan Note (Signed)
Urged to quit 

## 2013-11-09 NOTE — Assessment & Plan Note (Signed)

## 2013-11-13 ENCOUNTER — Other Ambulatory Visit: Payer: Self-pay | Admitting: Pulmonary Disease

## 2013-11-14 ENCOUNTER — Other Ambulatory Visit: Payer: Self-pay | Admitting: Pulmonary Disease

## 2013-11-14 ENCOUNTER — Encounter: Payer: Self-pay | Admitting: Internal Medicine

## 2013-11-14 ENCOUNTER — Other Ambulatory Visit: Payer: Self-pay | Admitting: Internal Medicine

## 2013-11-14 MED ORDER — LINAGLIPTIN 5 MG PO TABS: 5.0000 mg | ORAL_TABLET | Freq: Every day | ORAL | Status: DC

## 2013-11-16 ENCOUNTER — Encounter: Payer: Self-pay | Admitting: Internal Medicine

## 2013-11-16 LAB — URINALYSIS, ROUTINE W REFLEX MICROSCOPIC
BILIRUBIN URINE: NEGATIVE
Hgb urine dipstick: NEGATIVE
KETONES UR: NEGATIVE
Leukocytes, UA: NEGATIVE
Nitrite: NEGATIVE
PH: 6.5 (ref 5.0–8.0)
Specific Gravity, Urine: 1.02 (ref 1.000–1.030)
Total Protein, Urine: NEGATIVE
Urine Glucose: NEGATIVE
Urobilinogen, UA: 0.2 (ref 0.0–1.0)
WBC, UA: NONE SEEN — AB (ref 0–?)

## 2013-11-16 LAB — MICROALBUMIN / CREATININE URINE RATIO
Creatinine,U: 77.2 mg/dL
Microalb Creat Ratio: 1.3 mg/g (ref 0.0–30.0)
Microalb, Ur: 1 mg/dL (ref 0.0–1.9)

## 2013-12-07 ENCOUNTER — Ambulatory Visit: Payer: 59 | Admitting: Pulmonary Disease

## 2013-12-20 ENCOUNTER — Other Ambulatory Visit: Payer: Self-pay | Admitting: Internal Medicine

## 2014-02-08 ENCOUNTER — Other Ambulatory Visit: Payer: Self-pay | Admitting: Pulmonary Disease

## 2014-02-08 ENCOUNTER — Other Ambulatory Visit: Payer: Self-pay | Admitting: Internal Medicine

## 2014-02-16 ENCOUNTER — Other Ambulatory Visit: Payer: Self-pay | Admitting: Pulmonary Disease

## 2014-02-19 ENCOUNTER — Other Ambulatory Visit: Payer: Self-pay

## 2014-02-19 MED ORDER — ROSUVASTATIN CALCIUM 20 MG PO TABS: 20.0000 mg | ORAL_TABLET | Freq: Every day | ORAL | Status: DC

## 2014-03-08 ENCOUNTER — Ambulatory Visit (INDEPENDENT_AMBULATORY_CARE_PROVIDER_SITE_OTHER): Payer: 59 | Admitting: Internal Medicine

## 2014-03-08 ENCOUNTER — Encounter: Payer: Self-pay | Admitting: Internal Medicine

## 2014-03-08 VITALS — BP 128/90 | HR 90 | Temp 98.3°F | Resp 12 | Wt 190.4 lb

## 2014-03-08 DIAGNOSIS — J3089 Other allergic rhinitis: Secondary | ICD-10-CM

## 2014-03-08 DIAGNOSIS — J209 Acute bronchitis, unspecified: Secondary | ICD-10-CM

## 2014-03-08 DIAGNOSIS — Z23 Encounter for immunization: Secondary | ICD-10-CM

## 2014-03-08 MED ORDER — AZITHROMYCIN 250 MG PO TABS: ORAL_TABLET | ORAL | Status: DC

## 2014-03-08 MED ORDER — HYDROCODONE-HOMATROPINE 5-1.5 MG/5ML PO SYRP: 5.0000 mL | ORAL_SOLUTION | Freq: Four times a day (QID) | ORAL | Status: DC | PRN

## 2014-03-08 NOTE — Progress Notes (Signed)
Pre visit review using our clinic review tool, if applicable. No additional management support is needed unless otherwise documented below in the visit note. 

## 2014-03-08 NOTE — Patient Instructions (Signed)

## 2014-03-08 NOTE — Progress Notes (Signed)
   Subjective:    Patient ID: Jerome Waller, male    DOB: 09-Feb-1966, 48 y.o.   MRN: [id number]  HPI   Symptoms began last week as a slight sore throat, sneezing, and some itchy, watery eyes. He subsequently developed a cough which was paroxysmal, lasting up to 20 seconds. He is now producing thick brown sputum. He has had chills and significant rhinitis. He's noticed some wheezing with the cough as well as shortness of breath.   His wife was ill with a respiratory infection.  He does smoke less than a pack per day.  He is also diabetic but his fasting glucoses are  in the low 100s. His A1c was 9.5% in 5/15. He is requesting a "Depo shot" today.  Review of Systems   He specifically denies purulent nasal secretions or significant pain in the frontal or maxillary sinuses.  He has no fever or sweats.        Objective:   Physical Exam General appearance:good health ;well nourished; no acute distress or increased work of breathing is present.  No  lymphadenopathy about the head, neck, or axilla noted.   Eyes: No conjunctival inflammation or lid edema is present. There is no scleral icterus.  Ears:  External ear exam shows no significant lesions or deformities.  Otoscopic examination reveals clear canals, tympanic membranes are intact bilaterally without bulging, retraction, inflammation or discharge.  Nose:  External nasal examination shows no deformity or inflammation. Nasal mucosa are dry without lesions or exudates. No septal dislocation or deviation.No obstruction to airflow.   Oral exam: Dental hygiene is good; lips and gums are healthy appearing.There is no oropharyngeal erythema or exudate noted.   Neck:  No deformities, thyromegaly, masses, or tenderness noted.   Supple with full range of motion without pain.   Heart:  Normal rate and regular rhythm. S1 and S2 normal without gallop, murmur, click, rub or other extra sounds.   Lungs:Chest clear to auscultation; no wheezes,  rhonchi,rales ,or rubs present.No increased work of breathing.    Extremities:  No cyanosis, edema, or clubbing  noted    Skin: Warm & dry w/o jaundice or tenting.         Assessment & Plan:  #1 acute bronchitis w/o bronchospasm clinically #2 rhinitis #3 DM; reported FBS excellent Plan: See orders and recommendations

## 2014-03-11 ENCOUNTER — Telehealth: Payer: Self-pay | Admitting: Internal Medicine

## 2014-03-11 NOTE — Telephone Encounter (Signed)
emmi mailed  °

## 2014-05-17 ENCOUNTER — Ambulatory Visit: Payer: 59 | Admitting: Internal Medicine

## 2014-11-14 DIAGNOSIS — Z0289 Encounter for other administrative examinations: Secondary | ICD-10-CM

## 2014-11-26 ENCOUNTER — Telehealth: Payer: Self-pay | Admitting: Internal Medicine

## 2014-11-26 NOTE — Telephone Encounter (Signed)
Patient has a harding time hearing Dr. Jenny Reichmann and he was wondering if he can transfer with you.

## 2014-11-26 NOTE — Telephone Encounter (Signed)
Ok with me 

## 2014-11-26 NOTE — Telephone Encounter (Signed)
yes

## 2014-12-05 ENCOUNTER — Ambulatory Visit: Payer: Self-pay | Admitting: Internal Medicine

## 2014-12-05 ENCOUNTER — Encounter: Payer: Self-pay | Admitting: Internal Medicine

## 2014-12-05 ENCOUNTER — Other Ambulatory Visit (INDEPENDENT_AMBULATORY_CARE_PROVIDER_SITE_OTHER): Payer: 59

## 2014-12-05 ENCOUNTER — Ambulatory Visit (INDEPENDENT_AMBULATORY_CARE_PROVIDER_SITE_OTHER): Payer: 59 | Admitting: Internal Medicine

## 2014-12-05 VITALS — BP 128/90 | HR 95 | Temp 98.7°F | Resp 16 | Ht 73.0 in | Wt 188.8 lb

## 2014-12-05 DIAGNOSIS — I10 Essential (primary) hypertension: Secondary | ICD-10-CM | POA: Insufficient documentation

## 2014-12-05 DIAGNOSIS — E785 Hyperlipidemia, unspecified: Secondary | ICD-10-CM | POA: Diagnosis not present

## 2014-12-05 DIAGNOSIS — E118 Type 2 diabetes mellitus with unspecified complications: Secondary | ICD-10-CM | POA: Diagnosis not present

## 2014-12-05 LAB — COMPREHENSIVE METABOLIC PANEL
ALT: 39 U/L (ref 0–53)
AST: 28 U/L (ref 0–37)
Albumin: 4.4 g/dL (ref 3.5–5.2)
Alkaline Phosphatase: 111 U/L (ref 39–117)
BILIRUBIN TOTAL: 0.8 mg/dL (ref 0.2–1.2)
BUN: 15 mg/dL (ref 6–23)
CALCIUM: 9.6 mg/dL (ref 8.4–10.5)
CHLORIDE: 104 meq/L (ref 96–112)
CO2: 29 mEq/L (ref 19–32)
CREATININE: 1 mg/dL (ref 0.40–1.50)
GFR: 85.13 mL/min (ref >60.00)
Glucose, Bld: 169 mg/dL — ABNORMAL HIGH (ref 70–99)
Potassium: 4.1 mEq/L (ref 3.5–5.1)
Sodium: 137 mEq/L (ref 135–145)
Total Protein: 6.9 g/dL (ref 6.0–8.3)

## 2014-12-05 LAB — CBC WITH DIFFERENTIAL/PLATELET
BASOS ABS: 0 10*3/uL (ref 0.0–0.1)
BASOS PCT: 0.5 % (ref 0.0–3.0)
Eosinophils Absolute: 0.2 10*3/uL (ref 0.0–0.7)
Eosinophils Relative: 2.6 % (ref 0.0–5.0)
HCT: 49.3 % (ref 39.0–52.0)
Hemoglobin: 17 g/dL (ref 13.0–17.0)
LYMPHS PCT: 39.2 % (ref 12.0–46.0)
Lymphs Abs: 3 10*3/uL (ref 0.7–4.0)
MCHC: 34.5 g/dL (ref 30.0–36.0)
MCV: 95.3 fl (ref 78.0–100.0)
MONO ABS: 0.5 10*3/uL (ref 0.1–1.0)
Monocytes Relative: 6.6 % (ref 3.0–12.0)
NEUTROS PCT: 51.1 % (ref 43.0–77.0)
Neutro Abs: 3.9 10*3/uL (ref 1.4–7.7)
PLATELETS: 213 10*3/uL (ref 150.0–400.0)
RBC: 5.17 Mil/uL (ref 4.22–5.81)
RDW: 12.6 % (ref 11.5–15.5)
WBC: 7.7 10*3/uL (ref 4.0–10.5)

## 2014-12-05 LAB — LIPID PANEL
CHOLESTEROL: 160 mg/dL (ref 0–200)
HDL: 47.5 mg/dL (ref >39.00)
LDL Cholesterol: 81 mg/dL (ref 0–99)
NonHDL: 112.5
Total CHOL/HDL Ratio: 3
Triglycerides: 158 mg/dL — ABNORMAL HIGH (ref 0.0–149.0)
VLDL: 31.6 mg/dL (ref 0.0–40.0)

## 2014-12-05 LAB — MICROALBUMIN / CREATININE URINE RATIO
Creatinine,U: 213.6 mg/dL
Microalb Creat Ratio: 0.3 mg/g (ref 0.0–30.0)
Microalb, Ur: 0.7 mg/dL (ref 0.0–1.9)

## 2014-12-05 LAB — URINALYSIS, ROUTINE W REFLEX MICROSCOPIC
BILIRUBIN URINE: NEGATIVE
HGB URINE DIPSTICK: NEGATIVE
KETONES UR: NEGATIVE
Leukocytes, UA: NEGATIVE
Nitrite: NEGATIVE
RBC / HPF: NONE SEEN (ref 0–?)
Specific Gravity, Urine: >=1.03 — AB (ref 1.000–1.030)
TOTAL PROTEIN, URINE-UPE24: NEGATIVE
URINE GLUCOSE: NEGATIVE
UROBILINOGEN UA: 0.2 (ref 0.0–1.0)
WBC, UA: NONE SEEN (ref 0–?)
pH: 5.5 (ref 5.0–8.0)

## 2014-12-05 LAB — HEMOGLOBIN A1C: Hgb A1c MFr Bld: 8.9 % — ABNORMAL HIGH (ref 4.6–6.5)

## 2014-12-05 LAB — TSH: TSH: 1.13 u[IU]/mL (ref 0.35–4.50)

## 2014-12-05 MED ORDER — AZILSARTAN MEDOXOMIL 40 MG PO TABS: 1.0000 | ORAL_TABLET | Freq: Every day | ORAL | Status: DC

## 2014-12-05 NOTE — Progress Notes (Signed)
Subjective:  Patient ID: Jerome Waller, male    DOB: Nov 10, 1965  Age: 49 y.o. MRN: [id number]  CC: Diabetes   HPI Jerome Waller presents for DM for 7 years, he has recently been seeing a FP and was started last week on trulicity, he has tried multiple oral agents over the years without much improvement in his blood sugars. He feels well today and offers no complaints.  Outpatient Prescriptions Prior to Visit  Medication Sig Dispense Refill  . aspirin 81 MG tablet Take 81 mg by mouth daily.      Marland Kitchen glimepiride (AMARYL) 4 MG tablet TAKE 1 TABLET EVERY DAY BEFORE MEALS BREAKFAST 30 tablet 11  . glucose blood (BAYER CONTOUR TEST) test strip Use as instructed to check blood sugar 100 each 2  . metFORMIN (GLUCOPHAGE) 500 MG tablet TAKE 2 TABLETS (1,000 MG TOTAL) BY MOUTH 2 (TWO) TIMES DAILY WITH A MEAL. 120 tablet 11  . Multiple Vitamin (MULTIVITAMIN) capsule Take 1 capsule by mouth daily.      . Omega-3 Fatty Acids (FISH OIL) 1200 MG CAPS Take 1 capsule by mouth daily.      . rosuvastatin (CRESTOR) 20 MG tablet Take 1 tablet (20 mg total) by mouth daily. 30 tablet 11  . azithromycin (ZITHROMAX Z-PAK) 250 MG tablet 2 day 1, then 1 qd 6 tablet 0  . HYDROcodone-homatropine (HYDROMET) 5-1.5 MG/5ML syrup Take 5 mLs by mouth every 6 (six) hours as needed for cough. 120 mL 0  . linagliptin (TRADJENTA) 5 MG TABS tablet Take 1 tablet (5 mg total) by mouth daily. 90 tablet 3   No facility-administered medications prior to visit.    ROS Review of Systems  Constitutional: Negative.  Negative for fever, chills, diaphoresis, appetite change and fatigue.  HENT: Negative.   Eyes: Negative.   Respiratory: Negative.  Negative for cough, choking, chest tightness, shortness of breath and stridor.   Cardiovascular: Negative.  Negative for chest pain and leg swelling.  Gastrointestinal: Negative.  Negative for nausea, vomiting, abdominal pain, diarrhea, constipation and blood in stool.  Endocrine:  Negative.  Negative for polydipsia, polyphagia and polyuria.  Genitourinary: Negative.  Negative for difficulty urinating.  Musculoskeletal: Negative.   Skin: Negative.   Allergic/Immunologic: Negative.   Neurological: Negative.   Hematological: Negative.  Negative for adenopathy.  Psychiatric/Behavioral: Negative.     Objective:  BP 128/90 mmHg  Pulse 95  Temp(Src) 98.7 F (37.1 C) (Oral)  Resp 16  Ht 6\' 1"  (1.854 m)  Wt 188 lb 12 oz (85.616 kg)  BMI 24.91 kg/m2  SpO2 97%  BP Readings from Last 3 Encounters:  12/05/14 128/90  03/08/14 128/90  11/09/13 122/80    Wt Readings from Last 3 Encounters:  12/05/14 188 lb 12 oz (85.616 kg)  03/08/14 190 lb 6 oz (86.354 kg)  11/09/13 183 lb 8 oz (83.235 kg)    Physical Exam  Constitutional: He is oriented to person, place, and time. He appears well-developed and well-nourished. No distress.  HENT:  Head: Normocephalic and atraumatic.  Mouth/Throat: Oropharynx is clear and moist. No oropharyngeal exudate.  Eyes: Conjunctivae are normal. Right eye exhibits no discharge. Left eye exhibits no discharge. No scleral icterus.  Neck: Normal range of motion. Neck supple. No JVD present. No tracheal deviation present. No thyromegaly present.  Cardiovascular: Normal rate, regular rhythm, normal heart sounds and intact distal pulses.  Exam reveals no gallop and no friction rub.   No murmur heard. Pulmonary/Chest: Effort normal and breath  sounds normal. No stridor. No respiratory distress. He has no wheezes. He has no rales. He exhibits no tenderness.  Abdominal: Soft. Bowel sounds are normal. He exhibits no distension and no mass. There is no tenderness. There is no rebound and no guarding.  Musculoskeletal: Normal range of motion. He exhibits no edema or tenderness.  Neurological: He is oriented to person, place, and time.  Skin: Skin is warm and dry. No rash noted. He is not diaphoretic. No erythema. No pallor.  Psychiatric: He has a  normal mood and affect. His behavior is normal. Judgment and thought content normal.  Vitals reviewed.   Lab Results  Component Value Date   WBC 7.7 12/05/2014   HGB 17.0 12/05/2014   HCT 49.3 12/05/2014   PLT 213.0 12/05/2014   GLUCOSE 169* 12/05/2014   CHOL 160 12/05/2014   TRIG 158.0* 12/05/2014   HDL 47.50 12/05/2014   LDLDIRECT 139.0 07/31/2010   LDLCALC 81 12/05/2014   ALT 39 12/05/2014   AST 28 12/05/2014   NA 137 12/05/2014   K 4.1 12/05/2014   CL 104 12/05/2014   CREATININE 1.00 12/05/2014   BUN 15 12/05/2014   CO2 29 12/05/2014   TSH 1.13 12/05/2014   HGBA1C 8.9* 12/05/2014   MICROALBUR 0.7 12/05/2014    No results found.  Assessment & Plan:   Emiliano was seen today for diabetes.  Diagnoses and all orders for this visit:  Type II diabetes mellitus with manifestations - I am concerned that he may have DM type 1.5 and that he will need to start insulin to control the blood sugar so I have referred him to ENDO, will start an ARB for renal protection. He is due for an eye exam. Orders: -     Azilsartan Medoxomil (EDARBI) 40 MG TABS; Take 1 tablet by mouth daily. -     Lipid panel; Future -     Hemoglobin A1c; Future -     Microalbumin / creatinine urine ratio; Future -     Ambulatory referral to Ophthalmology -     Amb Referral to Nutrition and Diabetic E -     Ambulatory referral to Endocrinology  Hyperlipidemia with target LDL less than 100 - he has achieved his LDL goal Orders: -     Lipid panel; Future -     TSH; Future  Essential hypertension - will check his labs to screen for secondary causes and will start an ARB Orders: -     Azilsartan Medoxomil (EDARBI) 40 MG TABS; Take 1 tablet by mouth daily. -     Comprehensive metabolic panel; Future -     CBC with Differential/Platelet; Future -     Urinalysis, Routine w reflex microscopic (not at [facility]); Future   I have discontinued Mr. Shipes linagliptin, azithromycin, and HYDROcodone-homatropine. I  am also having him start on Azilsartan Medoxomil. Additionally, I am having him maintain his aspirin, Fish Oil, multivitamin, glucose blood, glimepiride, metFORMIN, rosuvastatin, and TRULICITY.  Meds ordered this encounter  Medications  . TRULICITY 6.30 ZS/0.1UX SOPN    Sig:   . Azilsartan Medoxomil (EDARBI) 40 MG TABS    Sig: Take 1 tablet by mouth daily.    Dispense:  30 tablet    Refill:  11     Follow-up: Return in about 3 months (around 03/07/2015).  Scarlette Calico, MD

## 2014-12-05 NOTE — Patient Instructions (Signed)

## 2014-12-05 NOTE — Progress Notes (Signed)
Pre visit review using our clinic review tool, if applicable. No additional management support is needed unless otherwise documented below in the visit note. 

## 2014-12-06 ENCOUNTER — Encounter: Payer: Self-pay | Admitting: Internal Medicine

## 2014-12-09 ENCOUNTER — Other Ambulatory Visit: Payer: Self-pay | Admitting: Internal Medicine

## 2014-12-09 DIAGNOSIS — E118 Type 2 diabetes mellitus with unspecified complications: Secondary | ICD-10-CM

## 2014-12-09 MED ORDER — METFORMIN HCL ER 750 MG PO TB24: 1500.0000 mg | ORAL_TABLET | Freq: Every day | ORAL | Status: DC

## 2014-12-10 ENCOUNTER — Telehealth: Payer: Self-pay | Admitting: Internal Medicine

## 2014-12-10 NOTE — Telephone Encounter (Signed)
The received fax from [facility] wants to change the Metformin HCL 500 mg tablet to extended release tablets to minimize GI upset

## 2014-12-10 NOTE — Telephone Encounter (Signed)
done

## 2014-12-12 ENCOUNTER — Ambulatory Visit: Payer: Self-pay | Admitting: Internal Medicine

## 2015-01-03 ENCOUNTER — Ambulatory Visit (INDEPENDENT_AMBULATORY_CARE_PROVIDER_SITE_OTHER): Payer: 59 | Admitting: Endocrinology

## 2015-01-03 ENCOUNTER — Encounter: Payer: Self-pay | Admitting: Endocrinology

## 2015-01-03 ENCOUNTER — Ambulatory Visit: Payer: 59 | Admitting: Dietician

## 2015-01-03 VITALS — BP 122/84 | HR 87 | Temp 97.7°F | Ht 73.0 in | Wt 184.0 lb

## 2015-01-03 DIAGNOSIS — E118 Type 2 diabetes mellitus with unspecified complications: Secondary | ICD-10-CM | POA: Diagnosis not present

## 2015-01-03 NOTE — Patient Instructions (Addendum)
good diet and exercise significantly improve the control of your diabetes.  please let me know if you wish to be referred to a dietician.  high blood sugar is very risky to your health.  you should see an eye doctor and dentist every year.  It is very important to get all recommended vaccinations.  controlling your blood pressure and cholesterol drastically reduces the damage diabetes does to your body.  Those who smoke should quit.  please discuss these with your doctor.  check your blood sugar once a day.  vary the time of day when you check, between before the 3 meals, and at bedtime.  also check if you have symptoms of your blood sugar being too high or too low.  please keep a record of the readings and bring it to your next appointment here.  You can write it on any piece of paper.  please call us sooner if your blood sugar goes below 70, or if you have a lot of readings over 200.  Please come back for a follow-up appointment in 6 weeks.   Our goals will be to get the a1c good, and to find an alternative the the glimepiride, if we can.

## 2015-01-03 NOTE — Progress Notes (Signed)
Subjective:    Patient ID: Jerome Waller, male    DOB: 1965/07/27, 49 y.o.   MRN: [id number]  HPI pt states DM was dx'ed in 2009; he has mild if any neuropathy of the lower extremities; he is unaware of any associated chronic complications; he has never been on insulin; pt says his diet and exercise are both good; he has never had pancreatitis, severe hypoglycemia or DKA.  Last month, trulicity was added to his 2 oral meds.  He has been on trulicity since mid-June, 2016.  Since then, he says cbg's vary from 128-170.  He denies hypoglycemia.  Past Medical History  Diagnosis Date  . Allergic rhinitis   . Cigarette smoker   . Hyperlipidemia   . DM (diabetes mellitus)   . History of condyloma acuminatum   . Wrist pain   . History of headache     Past Surgical History  Procedure Laterality Date  . Mandible surgery  1988    for underbite    History   Social History  . Marital Status: Married    Spouse Name: Jerome Waller  . Number of Children: N/A  . Years of Education: HS   Occupational History  . Chemical Operator    Social History Main Topics  . Smoking status: Current Every Day Smoker -- 0.50 packs/day for 21 years    Types: Cigarettes    Last Attempt to Quit: 06/16/2008  . Smokeless tobacco: Never Used     Comment: started back smoking  . Alcohol Use: Yes     Comment: social  . Drug Use: No  . Sexual Activity: Not on file   Other Topics Concern  . Not on file   Social History Narrative   Married to Jerome Waller 14+ years (also a patient of [facility])    Current Outpatient Prescriptions on File Prior to Visit  Medication Sig Dispense Refill  . aspirin 81 MG tablet Take 81 mg by mouth daily.      . Azilsartan Medoxomil (EDARBI) 40 MG TABS Take 1 tablet by mouth daily. 30 tablet 11  . glimepiride (AMARYL) 4 MG tablet TAKE 1 TABLET EVERY DAY BEFORE MEALS BREAKFAST 30 tablet 11  . glucose blood (BAYER CONTOUR TEST) test strip Use as instructed to check blood sugar 100  each 2  . metFORMIN (GLUCOPHAGE XR) 750 MG 24 hr tablet Take 2 tablets (1,500 mg total) by mouth daily with breakfast. 180 tablet 1  . Multiple Vitamin (MULTIVITAMIN) capsule Take 1 capsule by mouth daily.      . Omega-3 Fatty Acids (FISH OIL) 1200 MG CAPS Take 1 capsule by mouth daily.      . rosuvastatin (CRESTOR) 20 MG tablet Take 1 tablet (20 mg total) by mouth daily. 30 tablet 11  . TRULICITY 4.97 WY/6.3ZC SOPN     . [DISCONTINUED] sitaGLIPtin (JANUVIA) 100 MG tablet Take 1 tablet (100 mg total) by mouth daily. 30 tablet 11   No current facility-administered medications on file prior to visit.    No Known Allergies  Family History  Problem Relation Age of Onset  . Hyperlipidemia Father   . Hyperlipidemia Brother   . Hyperlipidemia Sister     and increased BS on diet alone  . Diabetes Paternal Grandfather     BP 122/84 mmHg  Pulse 87  Temp(Src) 97.7 F (36.5 C) (Oral)  Ht 6\' 1"  (1.854 m)  Wt 184 lb (83.462 kg)  BMI 24.28 kg/m2  SpO2 97%  Review of  Systems denies weight loss, blurry vision, headache, chest pain, sob, n/v, urinary frequency, muscle cramps, depression, cold intolerance, rhinorrhea, and easy bruising.  He has excessive diaphoresis.     Objective:   Physical Exam VS: see vs page GEN: no distress HEAD: head: no deformity eyes: no periorbital swelling, no proptosis external nose and ears are normal mouth: no lesion seen NECK: supple, thyroid is not enlarged CHEST WALL: no deformity LUNGS: clear to auscultation BREASTS:  No gynecomastia CV: reg rate and rhythm, no murmur ABD: abdomen is soft, nontender.  no hepatosplenomegaly.  not distended.  no hernia MUSCULOSKELETAL: muscle bulk and strength are grossly normal.  no obvious joint swelling.  gait is normal and steady EXTEMITIES: no deformity.  no ulcer on the feet.  feet are of normal color and temp.  no edema PULSES: dorsalis pedis intact bilat.  no carotid bruit NEURO:  cn 2-12 grossly intact.    readily moves all 4's.  sensation is intact to touch on the feet SKIN:  Normal texture and temperature.  No rash or suspicious lesion is visible.   NODES:  None palpable at the neck.  PSYCH: alert, well-oriented.  Does not appear anxious nor depressed.   Lab Results  Component Value Date   HGBA1C 8.9* 12/05/2014   i have reviewed outside records: last month, pt was noted to have elevated cbg's, and trulicity was added.      Assessment & Plan:  DM: control is apparently improved with the addition of trulicity.  i advised addition of invokana, but pt declines for now.  Therefore, pt is advised to continue the same medications.   Patient is advised the following: Patient Instructions  good diet and exercise significantly improve the control of your diabetes.  please let me know if you wish to be referred to a dietician.  high blood sugar is very risky to your health.  you should see an eye doctor and dentist every year.  It is very important to get all recommended vaccinations.  controlling your blood pressure and cholesterol drastically reduces the damage diabetes does to your body.  Those who smoke should quit.  please discuss these with your doctor.  check your blood sugar once a day.  vary the time of day when you check, between before the 3 meals, and at bedtime.  also check if you have symptoms of your blood sugar being too high or too low.  please keep a record of the readings and bring it to your next appointment here.  You can write it on any piece of paper.  please call us sooner if your blood sugar goes below 70, or if you have a lot of readings over 200.  Please come back for a follow-up appointment in 6 weeks.   Our goals will be to get the a1c good, and to find an alternative the the glimepiride, if we can.

## 2015-01-13 ENCOUNTER — Other Ambulatory Visit: Payer: Self-pay | Admitting: Internal Medicine

## 2015-02-14 ENCOUNTER — Ambulatory Visit: Payer: 59 | Admitting: Endocrinology

## 2015-03-17 ENCOUNTER — Other Ambulatory Visit: Payer: Self-pay | Admitting: Internal Medicine

## 2015-03-20 ENCOUNTER — Other Ambulatory Visit: Payer: Self-pay | Admitting: Internal Medicine

## 2015-03-20 ENCOUNTER — Other Ambulatory Visit: Payer: Self-pay

## 2015-03-20 MED ORDER — ROSUVASTATIN CALCIUM 20 MG PO TABS: 20.0000 mg | ORAL_TABLET | Freq: Every day | ORAL | Status: DC

## 2015-04-21 ENCOUNTER — Other Ambulatory Visit: Payer: Self-pay | Admitting: Internal Medicine

## 2015-06-05 ENCOUNTER — Telehealth: Payer: Self-pay

## 2015-06-05 DIAGNOSIS — E118 Type 2 diabetes mellitus with unspecified complications: Secondary | ICD-10-CM

## 2015-06-05 DIAGNOSIS — I10 Essential (primary) hypertension: Secondary | ICD-10-CM

## 2015-06-05 MED ORDER — LOSARTAN POTASSIUM 100 MG PO TABS: 100.0000 mg | ORAL_TABLET | Freq: Every day | ORAL | Status: DC

## 2015-06-05 NOTE — Telephone Encounter (Signed)
changed

## 2015-06-05 NOTE — Telephone Encounter (Signed)
Received fax stating pt requesting cheaper alternative to Edarbi. Please adivse.

## 2015-06-11 LAB — HM DIABETES EYE EXAM

## 2015-06-14 ENCOUNTER — Other Ambulatory Visit: Payer: Self-pay | Admitting: Internal Medicine

## 2015-06-18 ENCOUNTER — Telehealth: Payer: Self-pay | Admitting: Internal Medicine

## 2015-06-18 DIAGNOSIS — E118 Type 2 diabetes mellitus with unspecified complications: Secondary | ICD-10-CM

## 2015-06-18 MED ORDER — METFORMIN HCL ER 750 MG PO TB24: 1500.0000 mg | ORAL_TABLET | Freq: Every day | ORAL | Status: DC

## 2015-06-18 NOTE — Telephone Encounter (Signed)
done

## 2015-06-18 NOTE — Telephone Encounter (Signed)
Pt has an appt with Dr. Ronnald Ramp on 06/24/15. He was wondering if we can call in his metFORMIN (GLUCOPHAGE XR) 750 MG 24 hr tablet to [facility]. Please help

## 2015-06-24 ENCOUNTER — Encounter: Payer: Self-pay | Admitting: Internal Medicine

## 2015-06-24 ENCOUNTER — Ambulatory Visit (INDEPENDENT_AMBULATORY_CARE_PROVIDER_SITE_OTHER): Payer: 59 | Admitting: Internal Medicine

## 2015-06-24 ENCOUNTER — Other Ambulatory Visit (INDEPENDENT_AMBULATORY_CARE_PROVIDER_SITE_OTHER): Payer: 59

## 2015-06-24 VITALS — BP 130/64 | HR 95 | Temp 98.5°F | Resp 16 | Ht 73.0 in | Wt 193.0 lb

## 2015-06-24 DIAGNOSIS — E785 Hyperlipidemia, unspecified: Secondary | ICD-10-CM

## 2015-06-24 DIAGNOSIS — E118 Type 2 diabetes mellitus with unspecified complications: Secondary | ICD-10-CM | POA: Diagnosis not present

## 2015-06-24 LAB — HEMOGLOBIN A1C: HEMOGLOBIN A1C: 7.9 % — AB (ref 4.6–6.5)

## 2015-06-24 MED ORDER — SIMVASTATIN 40 MG PO TABS: 40.0000 mg | ORAL_TABLET | Freq: Every day | ORAL | Status: DC

## 2015-06-24 NOTE — Progress Notes (Signed)
Pre visit review using our clinic review tool, if applicable. No additional management support is needed unless otherwise documented below in the visit note. 

## 2015-06-24 NOTE — Patient Instructions (Signed)

## 2015-06-24 NOTE — Progress Notes (Signed)
Subjective:  Patient ID: Jerome Waller, male    DOB: [birth year]  Age: 50 y.o. MRN: [id number]  CC: Diabetes and Hyperlipidemia   HPI Jerome Waller presents for f/up - he complains that crestor is too expensive. He feels well and offers no complaints.  Outpatient Prescriptions Prior to Visit  Medication Sig Dispense Refill  . aspirin 81 MG tablet Take 81 mg by mouth daily.      Marland Kitchen glimepiride (AMARYL) 4 MG tablet TAKE 1 TABLET BY MOUTH ONCE DAILY BEFORE BREAKFAST 30 tablet 4  . glucose blood (BAYER CONTOUR TEST) test strip Use as instructed to check blood sugar 100 each 2  . losartan (COZAAR) 100 MG tablet Take 1 tablet (100 mg total) by mouth daily. 90 tablet 3  . metFORMIN (GLUCOPHAGE XR) 750 MG 24 hr tablet Take 2 tablets (1,500 mg total) by mouth daily with breakfast. 180 tablet 1  . Multiple Vitamin (MULTIVITAMIN) capsule Take 1 capsule by mouth daily.      . Omega-3 Fatty Acids (FISH OIL) 1200 MG CAPS Take 1 capsule by mouth daily.      . rosuvastatin (CRESTOR) 20 MG tablet Take 1 tablet (20 mg total) by mouth daily. 90 tablet 3  . TRULICITY 5.50 ZT/8.6WY SOPN      No facility-administered medications prior to visit.    ROS Review of Systems  Constitutional: Negative.  Negative for fever, chills, diaphoresis, appetite change and fatigue.  HENT: Negative.   Eyes: Negative.   Respiratory: Negative.  Negative for cough, choking, chest tightness, shortness of breath and stridor.   Cardiovascular: Negative.  Negative for chest pain, palpitations and leg swelling.  Gastrointestinal: Negative.  Negative for nausea, vomiting, abdominal pain, diarrhea, constipation and blood in stool.  Endocrine: Negative.  Negative for polydipsia, polyphagia and polyuria.  Genitourinary: Negative.  Negative for difficulty urinating.  Musculoskeletal: Negative.  Negative for myalgias, back pain, arthralgias and neck pain.  Skin: Negative.  Negative for color change and rash.    Allergic/Immunologic: Negative.   Neurological: Negative.  Negative for dizziness, tremors, weakness, light-headedness, numbness and headaches.  Hematological: Negative.  Negative for adenopathy. Does not bruise/bleed easily.  Psychiatric/Behavioral: Negative.     Objective:  BP 130/64 mmHg  Pulse 95  Temp(Src) 98.5 F (36.9 C) (Oral)  Resp 16  Ht '6\' 1"'  (1.854 m)  Wt 193 lb (87.544 kg)  BMI 25.47 kg/m2  SpO2 97%  BP Readings from Last 3 Encounters:  06/24/15 130/64  01/03/15 122/84  12/05/14 128/90    Wt Readings from Last 3 Encounters:  06/24/15 193 lb (87.544 kg)  01/03/15 184 lb (83.462 kg)  12/05/14 188 lb 12 oz (85.616 kg)    Physical Exam  Constitutional: He is oriented to person, place, and time. He appears well-developed and well-nourished. No distress.  HENT:  Head: Normocephalic and atraumatic.  Mouth/Throat: Oropharynx is clear and moist. No oropharyngeal exudate.  Eyes: Right eye exhibits no discharge. Left eye exhibits no discharge. No scleral icterus.  Neck: Normal range of motion. Neck supple. No JVD present. No tracheal deviation present. No thyromegaly present.  Cardiovascular: Normal rate, regular rhythm, normal heart sounds and intact distal pulses.  Exam reveals no gallop and no friction rub.   No murmur heard. Pulmonary/Chest: Effort normal and breath sounds normal. No stridor. No respiratory distress. He has no wheezes. He has no rales. He exhibits no tenderness.  Abdominal: Soft. Bowel sounds are normal. He exhibits no distension and no mass. There is  no tenderness. There is no rebound and no guarding.  Musculoskeletal: Normal range of motion. He exhibits no edema or tenderness.  Lymphadenopathy:    He has no cervical adenopathy.  Neurological: He is oriented to person, place, and time.  Skin: Skin is warm and dry. No rash noted. He is not diaphoretic. No erythema. No pallor.  Psychiatric: He has a normal mood and affect. His behavior is normal.  Judgment and thought content normal.  Vitals reviewed.   Lab Results  Component Value Date   WBC 7.7 12/05/2014   HGB 17.0 12/05/2014   HCT 49.3 12/05/2014   PLT 213.0 12/05/2014   GLUCOSE 160* 06/24/2015   CHOL 160 12/05/2014   TRIG 158.0* 12/05/2014   HDL 47.50 12/05/2014   LDLDIRECT 139.0 07/31/2010   LDLCALC 81 12/05/2014   ALT 39 12/05/2014   AST 28 12/05/2014   NA 139 06/24/2015   K 4.9 06/24/2015   CL 102 06/24/2015   CREATININE 1.07 06/24/2015   BUN 12 06/24/2015   CO2 30 06/24/2015   TSH 1.13 12/05/2014   HGBA1C 7.9* 06/24/2015   MICROALBUR 0.7 12/05/2014    No results found.  Assessment & Plan:   Jerome Waller was seen today for diabetes and hyperlipidemia.  Diagnoses and all orders for this visit:  Type 2 diabetes mellitus with complication, without long-term current use of insulin ([place])- his A1c has improved from 8.9% down to 7.9%, this is an improvement but he still has not met his A1c goal of about 7%. The patient is not willing to add on insulin at this time. I will increase Trulicity from 3.66 weekly to 1.5 weekly. -     simvastatin (ZOCOR) 40 MG tablet; Take 1 tablet (40 mg total) by mouth at bedtime. -     Hemoglobin A1c; Future -     Basic metabolic panel; Future -     Dulaglutide (TRULICITY) 1.5 QH/4.7ML SOPN; Inject 1 Act into the skin once a week.  Hyperlipidemia with target LDL less than 100- we'll change Crestor to a generic statin. -     simvastatin (ZOCOR) 40 MG tablet; Take 1 tablet (40 mg total) by mouth at bedtime.   I have discontinued Jerome Waller TRULICITY and rosuvastatin. I am also having him start on simvastatin and Dulaglutide. Additionally, I am having him maintain his aspirin, Fish Oil, multivitamin, glucose blood, glimepiride, losartan, metFORMIN, and EDARBI.  Meds ordered this encounter  Medications  . EDARBI 40 MG TABS    Sig:   . simvastatin (ZOCOR) 40 MG tablet    Sig: Take 1 tablet (40 mg total) by mouth at bedtime.     Dispense:  90 tablet    Refill:  3  . Dulaglutide (TRULICITY) 1.5 YY/5.0PT SOPN    Sig: Inject 1 Act into the skin once a week.    Dispense:  12 pen    Refill:  1     Follow-up: Return in about 4 months (around 10/22/2015).  Scarlette Calico, MD

## 2015-06-25 ENCOUNTER — Encounter: Payer: Self-pay | Admitting: Internal Medicine

## 2015-06-25 LAB — BASIC METABOLIC PANEL
BUN: 12 mg/dL (ref 6–23)
CHLORIDE: 102 meq/L (ref 96–112)
CO2: 30 mEq/L (ref 19–32)
Calcium: 10.2 mg/dL (ref 8.4–10.5)
Creatinine, Ser: 1.07 mg/dL (ref 0.40–1.50)
GFR: 78.55 mL/min (ref >60.00)
Glucose, Bld: 160 mg/dL — ABNORMAL HIGH (ref 70–99)
POTASSIUM: 4.9 meq/L (ref 3.5–5.1)
Sodium: 139 mEq/L (ref 135–145)

## 2015-06-25 MED ORDER — DULAGLUTIDE 1.5 MG/0.5ML ~~LOC~~ SOAJ: 1.0000 | SUBCUTANEOUS | Status: DC

## 2015-08-04 ENCOUNTER — Telehealth: Payer: Self-pay | Admitting: Internal Medicine

## 2015-08-04 NOTE — Telephone Encounter (Signed)
Spouse states that patient is not doing good on new dosage of trulicity.  States that his sugar is going up and he is having to eat every hour.  Is requesting to go back to the previous dosage.  Patient uses [facility] on [address].

## 2015-08-05 NOTE — Telephone Encounter (Signed)
pts wife called back regarding this and [facility] has also sent a request for his previous dose. He is leaving town on Friday and is needing this medication by tomorrow

## 2015-08-06 ENCOUNTER — Other Ambulatory Visit: Payer: Self-pay | Admitting: Internal Medicine

## 2015-08-06 DIAGNOSIS — E118 Type 2 diabetes mellitus with unspecified complications: Secondary | ICD-10-CM

## 2015-08-06 DIAGNOSIS — Z794 Long term (current) use of insulin: Principal | ICD-10-CM

## 2015-08-06 MED ORDER — DULAGLUTIDE 0.75 MG/0.5ML ~~LOC~~ SOAJ: 1.0000 | SUBCUTANEOUS | Status: DC

## 2015-08-06 NOTE — Telephone Encounter (Signed)
This is been taken care of

## 2015-08-28 ENCOUNTER — Telehealth: Payer: Self-pay

## 2015-08-28 NOTE — Telephone Encounter (Signed)
Started via cover my meds.  Key: [id number]

## 2015-08-29 NOTE — Telephone Encounter (Signed)
Not able to verify pt.   Spoke to pt wife and she stated that they have already picked it up.  Cancelling PA per spouse request.

## 2015-09-22 ENCOUNTER — Other Ambulatory Visit: Payer: Self-pay | Admitting: Internal Medicine

## 2015-12-17 ENCOUNTER — Other Ambulatory Visit: Payer: Self-pay | Admitting: Internal Medicine

## 2016-03-20 ENCOUNTER — Other Ambulatory Visit: Payer: Self-pay | Admitting: Internal Medicine

## 2016-03-22 ENCOUNTER — Other Ambulatory Visit: Payer: Self-pay | Admitting: Internal Medicine

## 2016-04-05 ENCOUNTER — Encounter: Payer: Self-pay | Admitting: Internal Medicine

## 2016-04-05 ENCOUNTER — Other Ambulatory Visit (INDEPENDENT_AMBULATORY_CARE_PROVIDER_SITE_OTHER): Payer: 59

## 2016-04-05 ENCOUNTER — Ambulatory Visit (INDEPENDENT_AMBULATORY_CARE_PROVIDER_SITE_OTHER): Payer: 59 | Admitting: Internal Medicine

## 2016-04-05 VITALS — BP 128/88 | HR 88 | Temp 98.4°F | Resp 16 | Ht 72.0 in | Wt 190.0 lb

## 2016-04-05 DIAGNOSIS — A630 Anogenital (venereal) warts: Secondary | ICD-10-CM

## 2016-04-05 DIAGNOSIS — Z0000 Encounter for general adult medical examination without abnormal findings: Secondary | ICD-10-CM

## 2016-04-05 DIAGNOSIS — L02219 Cutaneous abscess of trunk, unspecified: Secondary | ICD-10-CM | POA: Insufficient documentation

## 2016-04-05 DIAGNOSIS — I10 Essential (primary) hypertension: Secondary | ICD-10-CM

## 2016-04-05 DIAGNOSIS — E118 Type 2 diabetes mellitus with unspecified complications: Secondary | ICD-10-CM | POA: Diagnosis not present

## 2016-04-05 DIAGNOSIS — Z23 Encounter for immunization: Secondary | ICD-10-CM | POA: Diagnosis not present

## 2016-04-05 DIAGNOSIS — E785 Hyperlipidemia, unspecified: Secondary | ICD-10-CM | POA: Diagnosis not present

## 2016-04-05 DIAGNOSIS — L03319 Cellulitis of trunk, unspecified: Secondary | ICD-10-CM

## 2016-04-05 LAB — URINALYSIS, ROUTINE W REFLEX MICROSCOPIC
Bilirubin Urine: NEGATIVE
Hgb urine dipstick: NEGATIVE
Ketones, ur: NEGATIVE
Leukocytes, UA: NEGATIVE
Nitrite: NEGATIVE
PH: 5.5 (ref 5.0–8.0)
RBC / HPF: NONE SEEN (ref 0–?)
SPECIFIC GRAVITY, URINE: 1.015 (ref 1.000–1.030)
TOTAL PROTEIN, URINE-UPE24: NEGATIVE
Urine Glucose: NEGATIVE
Urobilinogen, UA: 0.2 (ref 0.0–1.0)
WBC UA: NONE SEEN (ref 0–?)

## 2016-04-05 LAB — CBC WITH DIFFERENTIAL/PLATELET
BASOS ABS: 0 10*3/uL (ref 0.0–0.1)
Basophils Relative: 0.3 % (ref 0.0–3.0)
EOS PCT: 1.6 % (ref 0.0–5.0)
Eosinophils Absolute: 0.2 10*3/uL (ref 0.0–0.7)
HEMATOCRIT: 44.9 % (ref 39.0–52.0)
Hemoglobin: 15.9 g/dL (ref 13.0–17.0)
LYMPHS PCT: 33.8 % (ref 12.0–46.0)
Lymphs Abs: 3.2 10*3/uL (ref 0.7–4.0)
MCHC: 35.5 g/dL (ref 30.0–36.0)
MCV: 94.3 fl (ref 78.0–100.0)
MONOS PCT: 6.6 % (ref 3.0–12.0)
Monocytes Absolute: 0.6 10*3/uL (ref 0.1–1.0)
NEUTROS ABS: 5.5 10*3/uL (ref 1.4–7.7)
Neutrophils Relative %: 57.7 % (ref 43.0–77.0)
PLATELETS: 253 10*3/uL (ref 150.0–400.0)
RBC: 4.76 Mil/uL (ref 4.22–5.81)
RDW: 12.5 % (ref 11.5–15.5)
WBC: 9.6 10*3/uL (ref 4.0–10.5)

## 2016-04-05 LAB — COMPREHENSIVE METABOLIC PANEL
ALT: 31 U/L (ref 0–53)
AST: 19 U/L (ref 0–37)
Albumin: 4.6 g/dL (ref 3.5–5.2)
Alkaline Phosphatase: 98 U/L (ref 39–117)
BILIRUBIN TOTAL: 0.5 mg/dL (ref 0.2–1.2)
BUN: 19 mg/dL (ref 6–23)
CALCIUM: 10 mg/dL (ref 8.4–10.5)
CHLORIDE: 104 meq/L (ref 96–112)
CO2: 27 meq/L (ref 19–32)
Creatinine, Ser: 0.96 mg/dL (ref 0.40–1.50)
GFR: 88.73 mL/min (ref >60.00)
Glucose, Bld: 119 mg/dL — ABNORMAL HIGH (ref 70–99)
POTASSIUM: 3.9 meq/L (ref 3.5–5.1)
Sodium: 138 mEq/L (ref 135–145)
Total Protein: 7 g/dL (ref 6.0–8.3)

## 2016-04-05 LAB — LIPID PANEL
CHOL/HDL RATIO: 4
CHOLESTEROL: 182 mg/dL (ref 0–200)
HDL: 41 mg/dL (ref >39.00)
NONHDL: 141.34
TRIGLYCERIDES: 301 mg/dL — AB (ref 0.0–149.0)
VLDL: 60.2 mg/dL — AB (ref 0.0–40.0)

## 2016-04-05 LAB — MICROALBUMIN / CREATININE URINE RATIO
CREATININE, U: 88.9 mg/dL
MICROALB/CREAT RATIO: 0.8 mg/g (ref 0.0–30.0)
Microalb, Ur: <0.7 mg/dL (ref 0.0–1.9)

## 2016-04-05 LAB — TSH: TSH: 1.66 u[IU]/mL (ref 0.35–4.50)

## 2016-04-05 LAB — HEMOGLOBIN A1C: Hgb A1c MFr Bld: 7.3 % — ABNORMAL HIGH (ref 4.6–6.5)

## 2016-04-05 MED ORDER — IMIQUIMOD 5 % EX CREA: TOPICAL_CREAM | CUTANEOUS | 0 refills | Status: DC

## 2016-04-05 MED ORDER — DOXYCYCLINE MONOHYDRATE 100 MG PO CAPS: 100.0000 mg | ORAL_CAPSULE | Freq: Two times a day (BID) | ORAL | 0 refills | Status: AC

## 2016-04-05 NOTE — Progress Notes (Signed)
Pre visit review using our clinic review tool, if applicable. No additional management support is needed unless otherwise documented below in the visit note. 

## 2016-04-05 NOTE — Patient Instructions (Signed)

## 2016-04-05 NOTE — Progress Notes (Signed)
Subjective:  Patient ID: Jerome Waller, male    DOB: 1965/09/20  Age: 50 y.o. MRN: [id number]  CC: Annual Exam and Diabetes   HPI Jerome Waller presents for f/up and CPX.  He complains that there is a wart on the right side of his penis.  He complains that there is a red/painful cyst on the right side of his chest.  Outpatient Medications Prior to Visit  Medication Sig Dispense Refill  . aspirin 81 MG tablet Take 81 mg by mouth daily.      . Dulaglutide (TRULICITY) A999333 0000000 SOPN Inject 1 Act into the skin once a week. 12 pen 3  . EDARBI 40 MG TABS Take 1 tablet by mouth daily.     Marland Kitchen glimepiride (AMARYL) 4 MG tablet Take 1 tablet (4 mg total) by mouth daily with breakfast. 30 tablet 0  . glucose blood (BAYER CONTOUR TEST) test strip Use as instructed to check blood sugar 100 each 2  . metFORMIN (GLUCOPHAGE-XR) 750 MG 24 hr tablet TAKE 2 TABLETS BY MOUTH DAILY WITH BREAKFAST. 180 tablet 2  . Multiple Vitamin (MULTIVITAMIN) capsule Take 1 capsule by mouth daily.      . Omega-3 Fatty Acids (FISH OIL) 1200 MG CAPS Take 1 capsule by mouth daily.      . rosuvastatin (CRESTOR) 20 MG tablet Take 1 tablet (20 mg total) by mouth daily. 30 tablet 0  . glimepiride (AMARYL) 4 MG tablet Take 1 tablet (4 mg total) by mouth daily with breakfast. 90 tablet 1  . losartan (COZAAR) 100 MG tablet Take 1 tablet (100 mg total) by mouth daily. 90 tablet 3  . simvastatin (ZOCOR) 40 MG tablet Take 1 tablet (40 mg total) by mouth at bedtime. 90 tablet 3   No facility-administered medications prior to visit.     ROS Review of Systems  Constitutional: Negative.  Negative for activity change, appetite change, chills, diaphoresis, fatigue and fever.  HENT: Negative for trouble swallowing.   Eyes: Negative.   Respiratory: Negative for cough, choking, chest tightness, shortness of breath and stridor.   Cardiovascular: Negative.  Negative for chest pain, palpitations and leg swelling.    Gastrointestinal: Negative.  Negative for abdominal pain, constipation, diarrhea and nausea.  Endocrine: Negative for polydipsia, polyphagia and polyuria.  Genitourinary: Negative.  Negative for difficulty urinating, dysuria, genital sores, penile pain, scrotal swelling, testicular pain and urgency.  Musculoskeletal: Negative.   Skin: Negative.   Neurological: Negative.   Hematological: Negative.  Negative for adenopathy. Does not bruise/bleed easily.  Psychiatric/Behavioral: Negative.     Objective:  BP 128/88 (BP Location: Left Arm, Patient Position: Sitting, Cuff Size: Normal)   Pulse 88   Temp 98.4 F (36.9 C) (Oral)   Resp 16   Ht 6' (1.829 m)   Wt 190 lb (86.2 kg)   BMI 25.77 kg/m   BP Readings from Last 3 Encounters:  04/05/16 128/88  06/24/15 130/64  01/03/15 122/84    Wt Readings from Last 3 Encounters:  04/05/16 190 lb (86.2 kg)  06/24/15 193 lb (87.5 kg)  01/03/15 184 lb (83.5 kg)    Physical Exam  Constitutional: He appears well-developed and well-nourished. No distress.  HENT:  Head: Normocephalic and atraumatic.  Mouth/Throat: Oropharynx is clear and moist. No oropharyngeal exudate.  Eyes: Conjunctivae are normal. Right eye exhibits no discharge. Left eye exhibits no discharge. No scleral icterus.  Neck: Normal range of motion. Neck supple. No JVD present. No tracheal deviation present. No  thyromegaly present.  Cardiovascular: Normal rate, regular rhythm and normal heart sounds.  Exam reveals no gallop and no friction rub.   No murmur heard. Pulmonary/Chest: Effort normal and breath sounds normal. No stridor. No respiratory distress. He has no wheezes. He has no rales. He exhibits no tenderness.    Abdominal: Soft. Bowel sounds are normal. He exhibits no distension and no mass. There is no tenderness. There is no rebound and no guarding. Hernia confirmed negative in the right inguinal area and confirmed negative in the left inguinal area.  Genitourinary:  Testes normal and penis normal.    Right testis shows no mass, no swelling and no tenderness. Right testis is descended. Left testis shows no mass, no swelling and no tenderness. Left testis is descended. Circumcised. No penile erythema or penile tenderness. No discharge found.  Lymphadenopathy:    He has no cervical adenopathy.       Right: No inguinal adenopathy present.       Left: No inguinal adenopathy present.  Skin: He is not diaphoretic.  Vitals reviewed.   Lab Results  Component Value Date   WBC 9.6 04/05/2016   HGB 15.9 04/05/2016   HCT 44.9 04/05/2016   PLT 253.0 04/05/2016   GLUCOSE 119 (H) 04/05/2016   CHOL 182 04/05/2016   TRIG 301.0 (H) 04/05/2016   HDL 41.00 04/05/2016   LDLDIRECT 93.0 04/05/2016   LDLCALC 81 12/05/2014   ALT 31 04/05/2016   AST 19 04/05/2016   NA 138 04/05/2016   K 3.9 04/05/2016   CL 104 04/05/2016   CREATININE 0.96 04/05/2016   BUN 19 04/05/2016   CO2 27 04/05/2016   TSH 1.66 04/05/2016   HGBA1C 7.3 (H) 04/05/2016   MICROALBUR <0.7 04/05/2016    No results found.  Assessment & Plan:   Astin was seen today for annual exam and diabetes.  Diagnoses and all orders for this visit:  Essential hypertension- his blood pressure is well-controlled, electrolytes and renal function are stable.  Type 2 diabetes mellitus with complication, without long-term current use of insulin ([place])- His A1c is down to 7.3%, blood sugars are adequately well-controlled. -     Hemoglobin A1c; Future -     Microalbumin / creatinine urine ratio; Future  Hyperlipidemia with target LDL less than 100- he is achieved his LDL goal is doing well on the statin.  Preventative health care- exam completed, labs ordered and reviewed, vaccines reviewed and updated, patient education material was given. -     Urinalysis, Routine w reflex microscopic (not at [facility]); Future -     Lipid panel; Future -     Comprehensive metabolic panel; Future -     CBC with  Differential/Platelet; Future -     TSH; Future  Genital warts -     imiquimod (ALDARA) 5 % cream; Apply topically 3 (three) times a week.  Cellulitis and abscess of trunk- will empirically treat with doxycycline, a culture of the exudate is pending, if this does not resolve with antibiotic therapy then I will perform an incision and drainage. -     doxycycline (MONODOX) 100 MG capsule; Take 1 capsule (100 mg total) by mouth 2 (two) times daily. -     WOUND CULTURE; Future  Need for prophylactic vaccination and inoculation against influenza -     Flu Vaccine QUAD 36+ mos IM  Need for prophylactic vaccination against Streptococcus pneumoniae (pneumococcus) -     Pneumococcal conjugate vaccine 13-valent   I have discontinued  Mr. Fogelman losartan and simvastatin. I am also having him start on doxycycline and imiquimod. Additionally, I am having him maintain his aspirin, Fish Oil, multivitamin, glucose blood, EDARBI, Dulaglutide, metFORMIN, glimepiride, and rosuvastatin.  Meds ordered this encounter  Medications  . doxycycline (MONODOX) 100 MG capsule    Sig: Take 1 capsule (100 mg total) by mouth 2 (two) times daily.    Dispense:  20 capsule    Refill:  0  . imiquimod (ALDARA) 5 % cream    Sig: Apply topically 3 (three) times a week.    Dispense:  12 each    Refill:  0     Follow-up: Return in about 4 weeks (around 05/03/2016).  Scarlette Calico, MD

## 2016-04-06 ENCOUNTER — Encounter: Payer: Self-pay | Admitting: Internal Medicine

## 2016-04-06 LAB — LDL CHOLESTEROL, DIRECT: Direct LDL: 93 mg/dL

## 2016-04-09 LAB — WOUND CULTURE
GRAM STAIN: NONE SEEN
ORGANISM ID, BACTERIA: NORMAL

## 2016-04-14 ENCOUNTER — Ambulatory Visit (INDEPENDENT_AMBULATORY_CARE_PROVIDER_SITE_OTHER): Payer: 59 | Admitting: Internal Medicine

## 2016-04-14 ENCOUNTER — Encounter: Payer: Self-pay | Admitting: Internal Medicine

## 2016-04-14 ENCOUNTER — Other Ambulatory Visit: Payer: Self-pay | Admitting: Internal Medicine

## 2016-04-14 VITALS — BP 140/80 | HR 100 | Temp 98.3°F | Resp 16 | Ht 72.0 in | Wt 187.0 lb

## 2016-04-14 DIAGNOSIS — R229 Localized swelling, mass and lump, unspecified: Secondary | ICD-10-CM | POA: Diagnosis not present

## 2016-04-14 NOTE — Progress Notes (Signed)
Pre visit review using our clinic review tool, if applicable. No additional management support is needed unless otherwise documented below in the visit note. 

## 2016-04-14 NOTE — Patient Instructions (Signed)
Wound Care °Taking care of your wound properly can help to prevent pain and infection. It can also help your wound to heal more quickly.  °HOW TO CARE FOR YOUR WOUND  °· Take or apply over-the-counter and prescription medicines only as told by your health care provider. °· If you were prescribed antibiotic medicine, take or apply it as told by your health care provider. Do not stop using the antibiotic even if your condition improves. °· Clean the wound each day or as told by your health care provider. °¨ Wash the wound with mild soap and water. °¨ Rinse the wound with water to remove all soap. °¨ Pat the wound dry with a clean towel. Do not rub it. °· There are many different ways to close and cover a wound. For example, a wound can be covered with stitches (sutures), skin glue, or adhesive strips. Follow instructions from your health care provider about: °¨ How to take care of your wound. °¨ When and how you should change your bandage (dressing). °¨ When you should remove your dressing. °¨ Removing whatever was used to close your wound. °· Check your wound every day for signs of infection. Watch for: °¨ Redness, swelling, or pain. °¨ Fluid, blood, or pus. °· Keep the dressing dry until your health care provider says it can be removed. Do not take baths, swim, use a hot tub, or do anything that would put your wound underwater until your health care provider approves. °· Raise (elevate) the injured area above the level of your heart while you are sitting or lying down. °· Do not scratch or pick at the wound. °· Keep all follow-up visits as told by your health care provider. This is important. °SEEK MEDICAL CARE IF: °· You received a tetanus shot and you have swelling, severe pain, redness, or bleeding at the injection site. °· You have a fever. °· Your pain is not controlled with medicine. °· You have increased redness, swelling, or pain at the site of your wound. °· You have fluid, blood, or pus coming from your  wound. °· You notice a bad smell coming from your wound or your dressing. °SEEK IMMEDIATE MEDICAL CARE IF: °· You have a red streak going away from your wound. °  °This information is not intended to replace advice given to you by your health care provider. Make sure you discuss any questions you have with your health care provider. °  °Document Released: 03/09/2008 Document Revised: 10/15/2014 Document Reviewed: 05/27/2014 °Elsevier Interactive Patient Education ©2016 Elsevier Inc[employer] ° °

## 2016-04-18 ENCOUNTER — Encounter: Payer: Self-pay | Admitting: Internal Medicine

## 2016-04-18 NOTE — Progress Notes (Signed)
Subjective:  Patient ID: Jerome Waller, male    DOB: 28-Dec-1965  Age: 50 y.o. MRN: [id number]  CC: Wound Check   HPI Jerome Waller presents for f/up on lesion over right upper abd, a clx was collected a week ago of a foul-smelling exudate but the results were negative. He has completed the course of doxy and tells me that there is no more exudate and the odor has resolved but he is still concerns about the mass.  Outpatient Medications Prior to Visit  Medication Sig Dispense Refill  . aspirin 81 MG tablet Take 81 mg by mouth daily.      Marland Kitchen doxycycline (MONODOX) 100 MG capsule Take 1 capsule (100 mg total) by mouth 2 (two) times daily. 20 capsule 0  . Dulaglutide (TRULICITY) A999333 0000000 SOPN Inject 1 Act into the skin once a week. 12 pen 3  . EDARBI 40 MG TABS Take 1 tablet by mouth daily.     Marland Kitchen glimepiride (AMARYL) 4 MG tablet Take 1 tablet (4 mg total) by mouth daily with breakfast. 30 tablet 0  . glucose blood (BAYER CONTOUR TEST) test strip Use as instructed to check blood sugar 100 each 2  . imiquimod (ALDARA) 5 % cream Apply topically 3 (three) times a week. 12 each 0  . metFORMIN (GLUCOPHAGE-XR) 750 MG 24 hr tablet TAKE 2 TABLETS BY MOUTH DAILY WITH BREAKFAST. 180 tablet 2  . Multiple Vitamin (MULTIVITAMIN) capsule Take 1 capsule by mouth daily.      . Omega-3 Fatty Acids (FISH OIL) 1200 MG CAPS Take 1 capsule by mouth daily.      . rosuvastatin (CRESTOR) 20 MG tablet Take 1 tablet (20 mg total) by mouth daily. 30 tablet 0   No facility-administered medications prior to visit.     ROS Review of Systems  All other systems reviewed and are negative.   Objective:  BP 140/80 (BP Location: Left Arm, Patient Position: Sitting, Cuff Size: Normal)   Pulse 100   Temp 98.3 F (36.8 C) (Oral)   Resp 16   Ht 6' (1.829 m)   Wt 187 lb (84.8 kg)   SpO2 97%   BMI 25.36 kg/m   BP Readings from Last 3 Encounters:  04/14/16 140/80  04/05/16 128/88  06/24/15 130/64    Wt  Readings from Last 3 Encounters:  04/14/16 187 lb (84.8 kg)  04/05/16 190 lb (86.2 kg)  06/24/15 193 lb (87.5 kg)    Physical Exam  Pulmonary/Chest: He exhibits mass. He exhibits no tenderness, no crepitus, no edema and no swelling.    The area was cleaned with Betadine and prepped and draped in sterile fashion. Local anesthesia was obtained with instillation of 2% lidocaine with epi. About 2 mL were used. A 4 mm punch incision was made and the contents of a whitish cystic structure was removed. There was no exudate. The cystic lesion is sent for pathology. One simple interrupted suture using 4-0 nylon was used to close the wound. He tolerated this well. Neosporin and a dressing were applied.    Lab Results  Component Value Date   WBC 9.6 04/05/2016   HGB 15.9 04/05/2016   HCT 44.9 04/05/2016   PLT 253.0 04/05/2016   GLUCOSE 119 (H) 04/05/2016   CHOL 182 04/05/2016   TRIG 301.0 (H) 04/05/2016   HDL 41.00 04/05/2016   LDLDIRECT 93.0 04/05/2016   LDLCALC 81 12/05/2014   ALT 31 04/05/2016   AST 19 04/05/2016   NA  138 04/05/2016   K 3.9 04/05/2016   CL 104 04/05/2016   CREATININE 0.96 04/05/2016   BUN 19 04/05/2016   CO2 27 04/05/2016   TSH 1.66 04/05/2016   HGBA1C 7.3 (H) 04/05/2016   MICROALBUR <0.7 04/05/2016    No results found.  Assessment & Plan:   Kasen was seen today for wound check.  Diagnoses and all orders for this visit:  Subcutaneous mass- This appears to be a benign cystic lesion such as sebaceous cyst, lipoma, or epidermoid inclusion cyst, I await the results of pathology to be certain that there isn't a malignant process -     Dermatology pathology; Future   I am having Jerome Waller maintain his aspirin, Fish Oil, multivitamin, glucose blood, EDARBI, Dulaglutide, metFORMIN, glimepiride, rosuvastatin, and imiquimod.  No orders of the defined types were placed in this encounter.    Follow-up: Return in about 1 week (around 04/21/2016).  Scarlette Calico,  MD

## 2016-04-21 ENCOUNTER — Other Ambulatory Visit: Payer: Self-pay | Admitting: Internal Medicine

## 2016-04-21 ENCOUNTER — Encounter: Payer: Self-pay | Admitting: Internal Medicine

## 2016-04-21 ENCOUNTER — Ambulatory Visit (INDEPENDENT_AMBULATORY_CARE_PROVIDER_SITE_OTHER): Payer: 59 | Admitting: Internal Medicine

## 2016-04-21 VITALS — BP 122/88 | HR 82 | Temp 97.8°F | Ht 72.0 in | Wt 187.0 lb

## 2016-04-21 DIAGNOSIS — N489 Disorder of penis, unspecified: Secondary | ICD-10-CM

## 2016-04-21 DIAGNOSIS — L570 Actinic keratosis: Secondary | ICD-10-CM | POA: Insufficient documentation

## 2016-04-21 NOTE — Patient Instructions (Signed)
Warts Warts are small growths on the skin. They are common and can occur on various areas of the body. A person may have one wart or multiple warts. Most warts are not painful, and they usually do not cause problems. However, warts can cause pain if they are large or occur in an area of the body where pressure will be applied to them, such as the bottom of the foot. In many cases, warts do not require treatment. They usually go away on their own over a period of many months to a couple years. Various treatments may be done for warts that cause problems or do not go away. Sometimes, warts go away and then come back again. CAUSES Warts are caused by a type of virus that is called human papillomavirus (HPV). This virus can spread from person to person through direct contact. Warts can also spread to other areas of the body when a person scratches a wart and then scratches another area of his or her body.  RISK FACTORS Warts are more likely to develop in:  People who are 10-20 years of age.  People who have a weakened body defense system (immune system). SYMPTOMS A wart may be round or oval or have an irregular shape. Most warts have a rough surface. Warts may range in color from skin color to light yellow, brown, or gray. They are generally less than  inch (1.3 cm) in size. Most warts are painless, but some can be painful when pressure is applied to them. DIAGNOSIS A wart can usually be diagnosed from its appearance. In some cases, a tissue sample may be removed (biopsy) to be looked at under a microscope. TREATMENT In many cases, warts do not need treatment. If treatment is needed, options may include:  Applying medicated solutions, creams, or patches to the wart. These may be over-the-counter or prescription medicines that make the skin soft so that layers will gradually shed away. In many cases, the medicine is applied one or two times per day and covered with a bandage.  Putting duct tape over  the top of the wart (occlusion). You will leave the tape in place for as long as told by your health care provider, then you will replace it with a new strip of tape. This is done until the wart goes away.  Freezing the wart with liquid nitrogen (cryotherapy).  Burning the wart with:  Laser treatment.  An electrified probe (electrocautery).  Injection of a medicine (Candida antigen) into the wart to help the body's immune system to fight off the wart.  Surgery to remove the wart. HOME CARE INSTRUCTIONS  Apply over-the-counter and prescription medicines only as told by your health care provider.  Do not apply over-the-counter wart medicines to your face or genitals before you ask your health care provider if it is okay to do so.  Do not scratch or pick at a wart.  Wash your hands after you touch a wart.  Avoid shaving hair that is over a wart.  Keep all follow-up visits as told by your health care provider. This is important. SEEK MEDICAL CARE IF:  Your warts do not improve after treatment.  You have redness, swelling, or pain at the site of a wart.  You have bleeding from a wart that does not stop with light pressure.  You have diabetes and you develop a wart.   This information is not intended to replace advice given to you by your health care provider. Make sure   you discuss any questions you have with your health care provider.   Document Released: 03/10/2005 Document Revised: 02/19/2015 Document Reviewed: 08/26/2014 Elsevier Interactive Patient Education 2016 Elsevier Inc.  

## 2016-04-21 NOTE — Progress Notes (Signed)
Pre visit review using our clinic review tool, if applicable. No additional management support is needed unless otherwise documented below in the visit note. 

## 2016-04-21 NOTE — Progress Notes (Signed)
Subjective:  Patient ID: Jerome Waller, male    DOB: [birth year]  Age: 50 y.o. MRN: [id number]  CC: Mass (follow up)   HPI MASIH YAKE presents for follow-up after an incisional biopsy was done on his right lower chest/upper abdomen about a week ago. He is here for suture removal. He tells me that the wound looks good with no pain, redness, swelling, or discharge. He is concerned that there may be more tumor so he wants to be referred for additional excision options. He also complains that the lesion on the shaft of his penis is not getting any better with the Aldara application.  Outpatient Medications Prior to Visit  Medication Sig Dispense Refill  . aspirin 81 MG tablet Take 81 mg by mouth daily.      . Dulaglutide (TRULICITY) A999333 0000000 SOPN Inject 1 Act into the skin once a week. 12 pen 3  . EDARBI 40 MG TABS Take 1 tablet by mouth daily.     Marland Kitchen glimepiride (AMARYL) 4 MG tablet TAKE 1 TABLET BY MOUTH DAILY WITH BREAKFAST. 30 tablet 3  . glucose blood (BAYER CONTOUR TEST) test strip Use as instructed to check blood sugar 100 each 2  . imiquimod (ALDARA) 5 % cream Apply topically 3 (three) times a week. 12 each 0  . metFORMIN (GLUCOPHAGE-XR) 750 MG 24 hr tablet TAKE 2 TABLETS BY MOUTH DAILY WITH BREAKFAST. 180 tablet 2  . Multiple Vitamin (MULTIVITAMIN) capsule Take 1 capsule by mouth daily.      . Omega-3 Fatty Acids (FISH OIL) 1200 MG CAPS Take 1 capsule by mouth daily.      . rosuvastatin (CRESTOR) 20 MG tablet TAKE 1 TABLET BY MOUTH DAILY. 30 tablet 11   No facility-administered medications prior to visit.     ROS Review of Systems  All other systems reviewed and are negative.   Objective:  BP 122/88 (BP Location: Left Arm, Patient Position: Sitting, Cuff Size: Normal)   Pulse 82   Temp 97.8 F (36.6 C)   Ht 6' (1.829 m)   Wt 187 lb (84.8 kg)   SpO2 98%   BMI 25.36 kg/m   BP Readings from Last 3 Encounters:  04/21/16 122/88  04/14/16 140/80  04/05/16 128/88     Wt Readings from Last 3 Encounters:  04/21/16 187 lb (84.8 kg)  04/14/16 187 lb (84.8 kg)  04/05/16 190 lb (86.2 kg)    Physical Exam  Abdominal:    Genitourinary:  Genitourinary Comments: Raised, pedunculated fleshy lesion on the right mid shaft of the penis is unchanged.  Vitals reviewed.   Lab Results  Component Value Date   WBC 9.6 04/05/2016   HGB 15.9 04/05/2016   HCT 44.9 04/05/2016   PLT 253.0 04/05/2016   GLUCOSE 119 (H) 04/05/2016   CHOL 182 04/05/2016   TRIG 301.0 (H) 04/05/2016   HDL 41.00 04/05/2016   LDLDIRECT 93.0 04/05/2016   LDLCALC 81 12/05/2014   ALT 31 04/05/2016   AST 19 04/05/2016   NA 138 04/05/2016   K 3.9 04/05/2016   CL 104 04/05/2016   CREATININE 0.96 04/05/2016   BUN 19 04/05/2016   CO2 27 04/05/2016   TSH 1.66 04/05/2016   HGBA1C 7.3 (H) 04/05/2016   MICROALBUR <0.7 04/05/2016    No results found.  Assessment & Plan:   Michaiah was seen today for mass.  Diagnoses and all orders for this visit:  Actinic keratosis- the biopsy results showed that this is an  actinic keratosis with verruca elements, I will send him to dermatology to see if an additional excision is needed. -     Ambulatory referral to Dermatology  Lesion of penis- I've asked him to see dermatology to consider having this excised. -     Ambulatory referral to Dermatology   I am having Mr. Kimak maintain his aspirin, Fish Oil, multivitamin, glucose blood, EDARBI, Dulaglutide, metFORMIN, imiquimod, rosuvastatin, and glimepiride.  No orders of the defined types were placed in this encounter.    Follow-up: Return if symptoms worsen or fail to improve.  Scarlette Calico, MD

## 2016-06-16 ENCOUNTER — Other Ambulatory Visit: Payer: Self-pay | Admitting: Internal Medicine

## 2016-06-16 DIAGNOSIS — I10 Essential (primary) hypertension: Secondary | ICD-10-CM

## 2016-06-16 DIAGNOSIS — E118 Type 2 diabetes mellitus with unspecified complications: Secondary | ICD-10-CM

## 2016-08-14 ENCOUNTER — Other Ambulatory Visit: Payer: Self-pay | Admitting: Internal Medicine

## 2016-08-14 DIAGNOSIS — Z794 Long term (current) use of insulin: Principal | ICD-10-CM

## 2016-08-14 DIAGNOSIS — E118 Type 2 diabetes mellitus with unspecified complications: Secondary | ICD-10-CM

## 2016-08-19 ENCOUNTER — Other Ambulatory Visit: Payer: Self-pay | Admitting: Internal Medicine

## 2016-08-24 ENCOUNTER — Telehealth: Payer: Self-pay | Admitting: Internal Medicine

## 2016-08-24 NOTE — Telephone Encounter (Signed)
Wife called in was upset because pts glimepiride (AMARYL) 4 MG tablet [[id number]]  Was not called in.  Can this be called in to [facility] drug

## 2016-08-25 MED ORDER — GLIMEPIRIDE 4 MG PO TABS: 4.0000 mg | ORAL_TABLET | Freq: Every day | ORAL | 0 refills | Status: DC

## 2016-08-25 NOTE — Telephone Encounter (Signed)
LOV for medication was 06/2015. Appt scheduled for 09/2016. Erx 30 day supply.   Forwarding to PCP as FYI.

## 2016-09-13 ENCOUNTER — Ambulatory Visit (INDEPENDENT_AMBULATORY_CARE_PROVIDER_SITE_OTHER): Payer: 59 | Admitting: Internal Medicine

## 2016-09-13 ENCOUNTER — Encounter: Payer: Self-pay | Admitting: Internal Medicine

## 2016-09-13 ENCOUNTER — Other Ambulatory Visit (INDEPENDENT_AMBULATORY_CARE_PROVIDER_SITE_OTHER): Payer: 59

## 2016-09-13 VITALS — BP 140/76 | HR 100 | Temp 98.1°F | Resp 16 | Ht 72.0 in | Wt 188.0 lb

## 2016-09-13 DIAGNOSIS — I10 Essential (primary) hypertension: Secondary | ICD-10-CM

## 2016-09-13 DIAGNOSIS — E118 Type 2 diabetes mellitus with unspecified complications: Secondary | ICD-10-CM | POA: Diagnosis not present

## 2016-09-13 DIAGNOSIS — E7800 Pure hypercholesterolemia, unspecified: Secondary | ICD-10-CM | POA: Diagnosis not present

## 2016-09-13 LAB — TRIGLYCERIDES: Triglycerides: 421 mg/dL — ABNORMAL HIGH (ref 0.0–149.0)

## 2016-09-13 LAB — BASIC METABOLIC PANEL
BUN: 23 mg/dL (ref 6–23)
CALCIUM: 10.5 mg/dL (ref 8.4–10.5)
CO2: 29 meq/L (ref 19–32)
Chloride: 102 mEq/L (ref 96–112)
Creatinine, Ser: 1.16 mg/dL (ref 0.40–1.50)
GFR: 71.19 mL/min (ref >60.00)
GLUCOSE: 206 mg/dL — AB (ref 70–99)
POTASSIUM: 5.1 meq/L (ref 3.5–5.1)
SODIUM: 137 meq/L (ref 135–145)

## 2016-09-13 LAB — HEMOGLOBIN A1C: Hgb A1c MFr Bld: 9.1 % — ABNORMAL HIGH (ref 4.6–6.5)

## 2016-09-13 NOTE — Progress Notes (Signed)
Subjective:  Patient ID: Jerome Waller, male    DOB: 12/14/1965  Age: 51 y.o. MRN: [id number]  CC: Hypertension; Hyperlipidemia; and Diabetes   HPI Jerome Waller presents for f/up - He feels well and offers no complaints. He doesn't monitor his blood sugar but he thinks it's well-controlled. He has ended up on both a sulfonylurea and a GLP-1 agonist in addition to metformin to control his blood sugar. He is tolerating this regimen well with no side effects.  Outpatient Medications Prior to Visit  Medication Sig Dispense Refill  . aspirin 81 MG tablet Take 81 mg by mouth daily.      Marland Kitchen EDARBI 40 MG TABS Take 1 tablet by mouth daily.     Marland Kitchen glimepiride (AMARYL) 4 MG tablet Take 1 tablet (4 mg total) by mouth daily with breakfast. 30 tablet 0  . glucose blood (BAYER CONTOUR TEST) test strip Use as instructed to check blood sugar 100 each 2  . losartan (COZAAR) 100 MG tablet TAKE 1 TABLET (100 MG TOTAL) BY MOUTH DAILY. REPLACES EDARBI. 90 tablet 3  . metFORMIN (GLUCOPHAGE-XR) 750 MG 24 hr tablet TAKE 2 TABLETS BY MOUTH DAILY WITH BREAKFAST. 180 tablet 2  . rosuvastatin (CRESTOR) 20 MG tablet TAKE 1 TABLET BY MOUTH DAILY. 30 tablet 11  . Omega-3 Fatty Acids (FISH OIL) 1200 MG CAPS Take 1 capsule by mouth daily.      . TRULICITY 9.67 EL/3.8BO SOPN INJECT 1 DOSE INTO THE SKIN ONCE A WEEK. 6 mL 1  . imiquimod (ALDARA) 5 % cream Apply topically 3 (three) times a week. 12 each 0  . Multiple Vitamin (MULTIVITAMIN) capsule Take 1 capsule by mouth daily.       No facility-administered medications prior to visit.     ROS Review of Systems  Constitutional: Negative for appetite change, diaphoresis, fatigue and unexpected weight change.  HENT: Negative.   Eyes: Negative for visual disturbance.  Respiratory: Negative for cough, chest tightness, shortness of breath and wheezing.   Cardiovascular: Negative for chest pain, palpitations and leg swelling.  Gastrointestinal: Negative for abdominal  pain, constipation, diarrhea, nausea and vomiting.  Endocrine: Negative for cold intolerance, heat intolerance, polydipsia, polyphagia and polyuria.  Genitourinary: Negative.  Negative for difficulty urinating, frequency, penile swelling and scrotal swelling.  Musculoskeletal: Negative for back pain and myalgias.  Skin: Negative.  Negative for color change and rash.  Allergic/Immunologic: Negative.   Neurological: Negative.  Negative for dizziness and weakness.  Hematological: Negative for adenopathy. Does not bruise/bleed easily.  Psychiatric/Behavioral: Negative.  Negative for suicidal ideas. The patient is not nervous/anxious.     Objective:  BP 140/76 (BP Location: Left Arm, Patient Position: Sitting, Cuff Size: Normal)   Pulse 100   Temp 98.1 F (36.7 C) (Oral)   Resp 16   Ht 6' (1.829 m)   Wt 188 lb (85.3 kg)   SpO2 98%   BMI 25.50 kg/m   BP Readings from Last 3 Encounters:  09/13/16 140/76  04/21/16 122/88  04/14/16 140/80    Wt Readings from Last 3 Encounters:  09/13/16 188 lb (85.3 kg)  04/21/16 187 lb (84.8 kg)  04/14/16 187 lb (84.8 kg)    Physical Exam  Constitutional: He is oriented to person, place, and time. No distress.  HENT:  Mouth/Throat: Oropharynx is clear and moist. No oropharyngeal exudate.  Eyes: Conjunctivae are normal. Right eye exhibits no discharge. Left eye exhibits no discharge. No scleral icterus.  Neck: Normal range of motion.  Neck supple. No JVD present. No tracheal deviation present. No thyromegaly present.  Cardiovascular: Normal rate, regular rhythm, normal heart sounds and intact distal pulses.  Exam reveals no gallop and no friction rub.   No murmur heard. Pulmonary/Chest: Effort normal and breath sounds normal. No stridor. No respiratory distress. He has no wheezes. He has no rales. He exhibits no tenderness.  Abdominal: Soft. Bowel sounds are normal. He exhibits no distension and no mass. There is no tenderness. There is no rebound  and no guarding.  Musculoskeletal: Normal range of motion. He exhibits no edema, tenderness or deformity.  Lymphadenopathy:    He has no cervical adenopathy.  Neurological: He is oriented to person, place, and time.  Skin: Skin is warm and dry. No rash noted. He is not diaphoretic. No erythema. No pallor.  Psychiatric: He has a normal mood and affect. His behavior is normal. Judgment and thought content normal.  Vitals reviewed.   Lab Results  Component Value Date   WBC 9.6 04/05/2016   HGB 15.9 04/05/2016   HCT 44.9 04/05/2016   PLT 253.0 04/05/2016   GLUCOSE 206 (H) 09/13/2016   CHOL 182 04/05/2016   TRIG 421.0 (H) 09/13/2016   HDL 41.00 04/05/2016   LDLDIRECT 93.0 04/05/2016   LDLCALC 81 12/05/2014   ALT 31 04/05/2016   AST 19 04/05/2016   NA 137 09/13/2016   K 5.1 09/13/2016   CL 102 09/13/2016   CREATININE 1.16 09/13/2016   BUN 23 09/13/2016   CO2 29 09/13/2016   TSH 1.66 04/05/2016   HGBA1C 9.1 (H) 09/13/2016   MICROALBUR <0.7 04/05/2016    No results found.  Assessment & Plan:   Jerome Waller was seen today for hypertension, hyperlipidemia and diabetes.  Diagnoses and all orders for this visit:  Type 2 diabetes mellitus with complication, without long-term current use of insulin ([place])- his A1c is up to 9.1%, his blood sugars are not well-controlled on the current regimen. The combination of the GLP-1 agonist and an SU is duplicitous and not effective. I therefore asked him to continue the sulfonylurea but will stop the GLP-1 agonist since he tells me it is too expensive and will start an SGLT2 inhibitor in addition to the metformin. -     Basic metabolic panel; Future -     Hemoglobin A1c; Future -     Ambulatory referral to Ophthalmology -     dapagliflozin propanediol (FARXIGA) 5 MG TABS tablet; Take 5 mg by mouth daily.  Essential hypertension- his blood pressure is adequately well-controlled, electrolytes and renal function are normal. -     Basic metabolic panel;  Future  Pure hypercholesterolemia- his triglycerides are just over 400 despite lifestyle modifications so I've asked him to start taking an omega-3 fish oil to lower the triglycerides and to prevent complications such as pancreatitis -     Triglycerides; Future -     Icosapent Ethyl (VASCEPA) 1 g CAPS; Take 2 capsules by mouth 2 (two) times daily.   I have discontinued Mr. Skoda Fish Oil, multivitamin, imiquimod, and TRULICITY. I am also having him start on dapagliflozin propanediol and Icosapent Ethyl. Additionally, I am having him maintain his aspirin, glucose blood, EDARBI, metFORMIN, rosuvastatin, losartan, and glimepiride.  Meds ordered this encounter  Medications  . dapagliflozin propanediol (FARXIGA) 5 MG TABS tablet    Sig: Take 5 mg by mouth daily.    Dispense:  90 tablet    Refill:  1  . Icosapent Ethyl (VASCEPA) 1 g  CAPS    Sig: Take 2 capsules by mouth 2 (two) times daily.    Dispense:  120 capsule    Refill:  11    The patient must receive Vascepa - must be on EPA since [place] products raise the LDL-cholesterol     Follow-up: Return in about 6 months (around 03/15/2017).  Scarlette Calico, MD

## 2016-09-13 NOTE — Patient Instructions (Signed)

## 2016-09-13 NOTE — Progress Notes (Signed)
Pre visit review using our clinic review tool, if applicable. No additional management support is needed unless otherwise documented below in the visit note. 

## 2016-09-14 ENCOUNTER — Telehealth: Payer: Self-pay

## 2016-09-14 ENCOUNTER — Encounter: Payer: Self-pay | Admitting: Internal Medicine

## 2016-09-14 MED ORDER — DAPAGLIFLOZIN PROPANEDIOL 5 MG PO TABS
5.0000 mg | ORAL_TABLET | Freq: Every day | ORAL | 1 refills | Status: DC
Start: 2016-09-14 — End: 2016-09-15

## 2016-09-14 MED ORDER — ICOSAPENT ETHYL 1 G PO CAPS: 2.0000 | ORAL_CAPSULE | Freq: Two times a day (BID) | ORAL | 11 refills | Status: DC

## 2016-09-14 NOTE — Telephone Encounter (Signed)
PA initiated for farxiga.   KEY: [id number]

## 2016-09-14 NOTE — Telephone Encounter (Signed)
PA-initiated for vascepa.   KEY: [id number]

## 2016-09-15 ENCOUNTER — Other Ambulatory Visit: Payer: Self-pay | Admitting: Internal Medicine

## 2016-09-15 DIAGNOSIS — E118 Type 2 diabetes mellitus with unspecified complications: Secondary | ICD-10-CM

## 2016-09-15 MED ORDER — EMPAGLIFLOZIN 10 MG PO TABS: 10.0000 mg | ORAL_TABLET | Freq: Every day | ORAL | 1 refills | Status: AC

## 2016-09-15 NOTE — Telephone Encounter (Addendum)
PA approved.   Will you call pt and informed of the same.

## 2016-09-22 NOTE — Telephone Encounter (Signed)
PA approved   PT informed.

## 2016-09-30 NOTE — Telephone Encounter (Signed)
Patient was called and informed that the PA for vascepa was approved.

## 2016-10-04 ENCOUNTER — Other Ambulatory Visit: Payer: Self-pay | Admitting: Internal Medicine

## 2017-01-10 ENCOUNTER — Other Ambulatory Visit: Payer: Self-pay | Admitting: Internal Medicine

## 2017-03-14 ENCOUNTER — Ambulatory Visit: Payer: 59 | Admitting: Internal Medicine

## 2017-06-23 ENCOUNTER — Other Ambulatory Visit: Payer: Self-pay | Admitting: Internal Medicine

## 2017-06-23 DIAGNOSIS — E118 Type 2 diabetes mellitus with unspecified complications: Secondary | ICD-10-CM

## 2017-06-23 DIAGNOSIS — I10 Essential (primary) hypertension: Secondary | ICD-10-CM

## 2017-07-18 ENCOUNTER — Other Ambulatory Visit: Payer: Self-pay | Admitting: Family Medicine

## 2017-07-18 ENCOUNTER — Ambulatory Visit
Admission: RE | Admit: 2017-07-18 | Discharge: 2017-07-18 | Disposition: A | Discharge status: Home / Self Care | Payer: 59 | Source: Ambulatory Visit | Attending: Family Medicine | Admitting: Family Medicine

## 2017-07-18 DIAGNOSIS — T1490XA Injury, unspecified, initial encounter: Secondary | ICD-10-CM

## 2017-08-12 HISTORY — PX: WRIST SURGERY: SHX841

## 2018-01-30 ENCOUNTER — Other Ambulatory Visit: Payer: Self-pay | Admitting: Orthopedic Surgery

## 2018-01-30 DIAGNOSIS — M13831 Other specified arthritis, right wrist: Secondary | ICD-10-CM

## 2018-02-06 ENCOUNTER — Other Ambulatory Visit: Payer: 59

## 2018-02-06 ENCOUNTER — Ambulatory Visit
Admission: RE | Admit: 2018-02-06 | Discharge: 2018-02-06 | Disposition: A | Discharge status: Home / Self Care | Payer: No Typology Code available for payment source | Source: Ambulatory Visit | Attending: Orthopedic Surgery | Admitting: Orthopedic Surgery

## 2018-02-06 DIAGNOSIS — M13831 Other specified arthritis, right wrist: Secondary | ICD-10-CM

## 2018-03-29 ENCOUNTER — Encounter (HOSPITAL_BASED_OUTPATIENT_CLINIC_OR_DEPARTMENT_OTHER): Payer: Self-pay | Admitting: *Deleted

## 2018-03-29 ENCOUNTER — Other Ambulatory Visit: Payer: Self-pay | Admitting: Orthopedic Surgery

## 2018-03-29 ENCOUNTER — Other Ambulatory Visit: Payer: Self-pay

## 2018-03-30 NOTE — H&P (Signed)
Jerome Waller is an 52 y.o. male.   CC / Reason for Visit: Right Wrist HPI: This patient returns reevaluation, having undergone CT scan of the right wrist on 02-06-18.  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  However as I reviewed the images, I am much more optimistic regarding the degree of bridging bone across the capitate and lunate.  There is however some dorsal prominence of the proximal angle of the proximal staple, which may call some dorsal impingement with attempted extension.  He reports that his nighttime pain is now gone, that used to wake him up prior surgery.  He just has dorsal pain with attempted extension and lack of extension.  He remains on long-term disability.  Past Medical History:  Diagnosis Date  . Allergic rhinitis   . Cigarette smoker   . DM (diabetes mellitus) ([place])   . History of condyloma acuminatum   . History of headache   . Hyperlipidemia   . Hypertension   . Painful orthopaedic hardware [facility])    right wrist  . Wrist pain     Past Surgical History:  Procedure Laterality Date  . [place]   for underbite  . [facility]    Family History  Problem Relation Age of Onset  . Hyperlipidemia Father   . Hyperlipidemia Brother   . Hyperlipidemia Sister        and increased BS on diet alone  . Diabetes Paternal Grandfather    Social History:  reports that he has been smoking cigarettes. He has a 10.50 pack-year smoking history. He has never used smokeless tobacco. He reports that he drinks alcohol. He reports that he does not use drugs.  Allergies: No Known Allergies  No medications prior to admission.    No results found for this or any previous visit (from the past 48 hour(s)). No results found.  Review of Systems  All other systems reviewed and are  negative.   Height 6' (1.829 m), weight 77.1 kg. Physical Exam  Constitutional:  WD, WN, NAD HEENT:  NCAT, EOMI Neuro/Psych:  Alert & oriented to person, place, and time; appropriate mood & affect Lymphatic: No generalized UE edema or lymphadenopathy Extremities / MSK:  Both UE are normal with respect to appearance, ranges of motion, joint stability, muscle strength/tone, sensation, & perfusion except as otherwise noted:  Incision on the dorsum of the right wrist is healing and becoming slightly softer slightly softer.  There is dorsal central pain with palpation.  Wrist range of motion actively: Flexion 40, extension 18.  Full digital motion.  Full forearm rotation.  Grip strength in position 2: Left 100, right 45.  Monofilament testing 2.83 all digits all sides.  Light touch sensibility intact with no altered sensation on any digit.  Carpal tunnel compression test negative for increasing subjective symptoms.  Labs / Xrays:  None today.  X-rays from 01-26-18: 3 views the right wrist ordered and obtained today demonstrate intact fixation across the midcarpal joint, without any new adverse changes, and what appears to have some increased bony healing.  Assessment:  5 months status post right wrist midcarpal fusion  Plan: The findings are discussed once again with the patient as well as his wife.  I indicated it is quite possible that he is having some impingement pain from the hardware.  I recommended exploration, with removal of hardware and evaluation of the extent of fusion at that time.  If there remains significant unfused areas, it may require additional steps such as possible bone grafting and revision fixation, instead of simply removal of the hardware.  Nonetheless, we will have to make that decision intraoperatively.  He would like to proceed next week in order to be sufficiently recovered for a planned trip at the end of September.  The details of the operative procedure were discussed  with the patient.  Questions were invited and answered.  In addition to the goal of the procedure, the risks of the procedure to include but not limited to bleeding; infection; damage to the nerves or blood vessels that could result in bleeding, numbness, weakness, chronic pain, and the need for additional procedures; stiffness; the need for revision surgery; and anesthetic risks were reviewed.  No specific outcome was guaranteed or implied.  Informed consent was obtained.  Jolyn Nap, MD 03/30/2018, 8:23 AM

## 2018-03-31 ENCOUNTER — Encounter (HOSPITAL_BASED_OUTPATIENT_CLINIC_OR_DEPARTMENT_OTHER)
Admission: RE | Admit: 2018-03-31 | Discharge: 2018-03-31 | Disposition: A | Discharge status: Home / Self Care | Payer: Self-pay | Source: Ambulatory Visit | Attending: Orthopedic Surgery | Admitting: Orthopedic Surgery

## 2018-03-31 DIAGNOSIS — Z01812 Encounter for preprocedural laboratory examination: Secondary | ICD-10-CM | POA: Insufficient documentation

## 2018-03-31 LAB — BASIC METABOLIC PANEL
Anion gap: 9 (ref 5–15)
BUN: 14 mg/dL (ref 6–20)
CO2: 25 mmol/L (ref 22–32)
CREATININE: 0.94 mg/dL (ref 0.61–1.24)
Calcium: 9.8 mg/dL (ref 8.9–10.3)
Chloride: 97 mmol/L — ABNORMAL LOW (ref 98–111)
GFR calc Af Amer: >60 mL/min (ref >60)
Glucose, Bld: 466 mg/dL — ABNORMAL HIGH (ref 70–99)
POTASSIUM: 4.8 mmol/L (ref 3.5–5.1)
SODIUM: 131 mmol/L — AB (ref 135–145)

## 2018-03-31 NOTE — Pre-Procedure Instructions (Signed)
Spoke with pt's wife(Karen) - notified her of blood sugar 466; she states pt. did not take his diabetes medication yesterday and just started back on it today. Advised her to call their PCP Dr. Tamsen Roers right away.  She states she will check his sugar right now.  Advised that if his blood sugar is highly elevated 04/03/2018, he will not be able to have surgery.  Wife expressed understanding.

## 2018-04-03 ENCOUNTER — Encounter (HOSPITAL_BASED_OUTPATIENT_CLINIC_OR_DEPARTMENT_OTHER): Payer: Self-pay | Admitting: Certified Registered"

## 2018-04-03 ENCOUNTER — Ambulatory Visit (HOSPITAL_BASED_OUTPATIENT_CLINIC_OR_DEPARTMENT_OTHER)
Admission: RE | Admit: 2018-04-03 | Discharge: 2018-04-03 | Disposition: A | Discharge status: Home / Self Care | Payer: Self-pay | Source: Ambulatory Visit | Attending: Orthopedic Surgery | Admitting: Orthopedic Surgery

## 2018-04-03 ENCOUNTER — Ambulatory Visit (HOSPITAL_BASED_OUTPATIENT_CLINIC_OR_DEPARTMENT_OTHER): Payer: Self-pay | Admitting: Anesthesiology

## 2018-04-03 ENCOUNTER — Other Ambulatory Visit: Payer: Self-pay

## 2018-04-03 ENCOUNTER — Ambulatory Visit (HOSPITAL_COMMUNITY): Payer: Self-pay

## 2018-04-03 ENCOUNTER — Encounter (HOSPITAL_BASED_OUTPATIENT_CLINIC_OR_DEPARTMENT_OTHER)
Admission: RE | Disposition: A | Discharge status: Home / Self Care | Payer: Self-pay | Source: Ambulatory Visit | Attending: Orthopedic Surgery

## 2018-04-03 DIAGNOSIS — E785 Hyperlipidemia, unspecified: Secondary | ICD-10-CM | POA: Insufficient documentation

## 2018-04-03 DIAGNOSIS — Z981 Arthrodesis status: Secondary | ICD-10-CM | POA: Insufficient documentation

## 2018-04-03 DIAGNOSIS — X58XXXA Exposure to other specified factors, initial encounter: Secondary | ICD-10-CM | POA: Insufficient documentation

## 2018-04-03 DIAGNOSIS — J309 Allergic rhinitis, unspecified: Secondary | ICD-10-CM | POA: Insufficient documentation

## 2018-04-03 DIAGNOSIS — Z8249 Family history of ischemic heart disease and other diseases of the circulatory system: Secondary | ICD-10-CM | POA: Insufficient documentation

## 2018-04-03 DIAGNOSIS — Z833 Family history of diabetes mellitus: Secondary | ICD-10-CM | POA: Insufficient documentation

## 2018-04-03 DIAGNOSIS — F17210 Nicotine dependence, cigarettes, uncomplicated: Secondary | ICD-10-CM | POA: Insufficient documentation

## 2018-04-03 DIAGNOSIS — T8484XA Pain due to internal orthopedic prosthetic devices, implants and grafts, initial encounter: Secondary | ICD-10-CM | POA: Insufficient documentation

## 2018-04-03 DIAGNOSIS — I10 Essential (primary) hypertension: Secondary | ICD-10-CM | POA: Insufficient documentation

## 2018-04-03 DIAGNOSIS — Z419 Encounter for procedure for purposes other than remedying health state, unspecified: Secondary | ICD-10-CM

## 2018-04-03 DIAGNOSIS — E119 Type 2 diabetes mellitus without complications: Secondary | ICD-10-CM | POA: Insufficient documentation

## 2018-04-03 HISTORY — DX: Pain due to internal orthopedic prosthetic devices, implants and grafts, initial encounter: T84.84XA

## 2018-04-03 HISTORY — PX: HARDWARE REMOVAL: SHX979

## 2018-04-03 HISTORY — PX: BONE EXCISION: SHX6730

## 2018-04-03 HISTORY — DX: Essential (primary) hypertension: I10

## 2018-04-03 LAB — GLUCOSE, CAPILLARY
GLUCOSE-CAPILLARY: 396 mg/dL — AB (ref 70–99)
Glucose-Capillary: 303 mg/dL — ABNORMAL HIGH (ref 70–99)

## 2018-04-03 SURGERY — REMOVAL, HARDWARE
Anesthesia: Monitor Anesthesia Care | Site: Wrist | Laterality: Right

## 2018-04-03 MED ORDER — BUPIVACAINE-EPINEPHRINE (PF) 0.25% -1:200000 IJ SOLN
INTRAMUSCULAR | Status: AC
  Filled 2018-04-03: qty 60

## 2018-04-03 MED ORDER — ONDANSETRON HCL 4 MG/2ML IJ SOLN
INTRAMUSCULAR | Status: DC | PRN
  Administered 2018-04-03: 4 mg via INTRAVENOUS

## 2018-04-03 MED ORDER — LIDOCAINE HCL (CARDIAC) PF 100 MG/5ML IV SOSY
PREFILLED_SYRINGE | INTRAVENOUS | Status: DC | PRN
  Administered 2018-04-03: 30 mg via INTRAVENOUS

## 2018-04-03 MED ORDER — FENTANYL CITRATE (PF) 100 MCG/2ML IJ SOLN
50.0000 ug | INTRAMUSCULAR | Status: DC | PRN
  Administered 2018-04-03: 100 ug via INTRAVENOUS

## 2018-04-03 MED ORDER — LACTATED RINGERS IV SOLN
INTRAVENOUS | Status: DC
  Administered 2018-04-03: 07:00:00 via INTRAVENOUS

## 2018-04-03 MED ORDER — HYDROMORPHONE HCL 1 MG/ML IJ SOLN: 0.2500 mg | INTRAMUSCULAR | Status: DC | PRN

## 2018-04-03 MED ORDER — CEFAZOLIN SODIUM-DEXTROSE 2-4 GM/100ML-% IV SOLN
2.0000 g | INTRAVENOUS | Status: AC
  Administered 2018-04-03: 2 g via INTRAVENOUS

## 2018-04-03 MED ORDER — FENTANYL CITRATE (PF) 100 MCG/2ML IJ SOLN
INTRAMUSCULAR | Status: AC
  Filled 2018-04-03: qty 2

## 2018-04-03 MED ORDER — BUPIVACAINE HCL (PF) 0.5 % IJ SOLN
INTRAMUSCULAR | Status: AC
  Filled 2018-04-03: qty 30

## 2018-04-03 MED ORDER — BUPIVACAINE-EPINEPHRINE (PF) 0.5% -1:200000 IJ SOLN
INTRAMUSCULAR | Status: DC | PRN
  Administered 2018-04-03: 30 mL via PERINEURAL

## 2018-04-03 MED ORDER — SCOPOLAMINE 1 MG/3DAYS TD PT72: 1.0000 | MEDICATED_PATCH | Freq: Once | TRANSDERMAL | Status: DC | PRN

## 2018-04-03 MED ORDER — MIDAZOLAM HCL 2 MG/2ML IJ SOLN
1.0000 mg | INTRAMUSCULAR | Status: DC | PRN
  Administered 2018-04-03 (×2): 2 mg via INTRAVENOUS

## 2018-04-03 MED ORDER — PROMETHAZINE HCL 25 MG/ML IJ SOLN: 6.2500 mg | INTRAMUSCULAR | Status: DC | PRN

## 2018-04-03 MED ORDER — ACETAMINOPHEN 325 MG PO TABS: 650.0000 mg | ORAL_TABLET | Freq: Four times a day (QID) | ORAL | Status: AC

## 2018-04-03 MED ORDER — IBUPROFEN 200 MG PO TABS: 600.0000 mg | ORAL_TABLET | Freq: Four times a day (QID) | ORAL | 0 refills | Status: AC

## 2018-04-03 MED ORDER — CEFAZOLIN SODIUM-DEXTROSE 2-4 GM/100ML-% IV SOLN
INTRAVENOUS | Status: AC
  Filled 2018-04-03: qty 100

## 2018-04-03 MED ORDER — MIDAZOLAM HCL 2 MG/2ML IJ SOLN
INTRAMUSCULAR | Status: AC
  Filled 2018-04-03: qty 2

## 2018-04-03 MED ORDER — PROPOFOL 500 MG/50ML IV EMUL
INTRAVENOUS | Status: DC | PRN
  Administered 2018-04-03: 75 ug/kg/min via INTRAVENOUS

## 2018-04-03 MED ORDER — BUPIVACAINE HCL (PF) 0.25 % IJ SOLN
INTRAMUSCULAR | Status: AC
  Filled 2018-04-03: qty 30

## 2018-04-03 MED ORDER — CLONIDINE HCL (ANALGESIA) 100 MCG/ML EP SOLN
EPIDURAL | Status: DC | PRN
  Administered 2018-04-03: 50 ug

## 2018-04-03 MED ORDER — OXYCODONE HCL 5 MG PO TABS: 5.0000 mg | ORAL_TABLET | Freq: Four times a day (QID) | ORAL | 0 refills | Status: AC | PRN

## 2018-04-03 SURGICAL SUPPLY — 64 items
BLADE AVERAGE 25MMX9MM (BLADE)
BLADE AVERAGE 25X9 (BLADE) IMPLANT
BLADE CLIPPER SURG (BLADE) ×3 IMPLANT
BLADE MINI RND TIP GREEN BEAV (BLADE) IMPLANT
BLADE SURG 15 STRL LF DISP TIS (BLADE) ×3 IMPLANT
BLADE SURG 15 STRL SS (BLADE) ×8
BNDG CMPR 9X4 STRL LF SNTH (GAUZE/BANDAGES/DRESSINGS) ×2
BNDG COHESIVE 4X5 TAN STRL (GAUZE/BANDAGES/DRESSINGS) ×3 IMPLANT
BNDG ESMARK 4X9 LF (GAUZE/BANDAGES/DRESSINGS) ×3 IMPLANT
BNDG GAUZE ELAST 4 BULKY (GAUZE/BANDAGES/DRESSINGS) ×4 IMPLANT
CHLORAPREP W/TINT 26ML (MISCELLANEOUS) ×4 IMPLANT
CORD BIPOLAR FORCEPS 12FT (ELECTRODE) ×4 IMPLANT
COVER BACK TABLE 60X90IN (DRAPES) ×4 IMPLANT
COVER MAYO STAND STRL (DRAPES) ×4 IMPLANT
COVER WAND RF STERILE (DRAPES) IMPLANT
CUFF TOURNIQUET SINGLE 18IN (TOURNIQUET CUFF) ×3 IMPLANT
DRAPE C-ARM 42X72 X-RAY (DRAPES) ×4 IMPLANT
DRAPE EXTREMITY T 121X128X90 (DRAPE) ×4 IMPLANT
DRAPE SURG 17X23 STRL (DRAPES) ×4 IMPLANT
DRSG ADAPTIC 3X8 NADH LF (GAUZE/BANDAGES/DRESSINGS) IMPLANT
DRSG EMULSION OIL 3X3 NADH (GAUZE/BANDAGES/DRESSINGS) ×3 IMPLANT
ELECT REM PT RETURN 9FT ADLT (ELECTROSURGICAL)
ELECTRODE REM PT RTRN 9FT ADLT (ELECTROSURGICAL) ×1 IMPLANT
GAUZE SPONGE 4X4 12PLY STRL LF (GAUZE/BANDAGES/DRESSINGS) ×4 IMPLANT
GLOVE BIO SURGEON STRL SZ7.5 (GLOVE) ×4 IMPLANT
GLOVE BIOGEL PI IND STRL 7.0 (GLOVE) ×4 IMPLANT
GLOVE BIOGEL PI IND STRL 8 (GLOVE) ×2 IMPLANT
GLOVE BIOGEL PI INDICATOR 7.0 (GLOVE) ×6
GLOVE BIOGEL PI INDICATOR 8 (GLOVE) ×2
GLOVE ECLIPSE 6.5 STRL STRAW (GLOVE) ×7 IMPLANT
GOWN STRL REUS W/ TWL LRG LVL3 (GOWN DISPOSABLE) ×4 IMPLANT
GOWN STRL REUS W/TWL LRG LVL3 (GOWN DISPOSABLE) ×8
GOWN STRL REUS W/TWL XL LVL3 (GOWN DISPOSABLE) ×4 IMPLANT
LOOP VESSEL MINI RED (MISCELLANEOUS) IMPLANT
NEEDLE HYPO 22GX1.5 SAFETY (NEEDLE) IMPLANT
NS IRRIG 1000ML POUR BTL (IV SOLUTION) ×4 IMPLANT
PACK BASIN DAY SURGERY FS (CUSTOM PROCEDURE TRAY) ×4 IMPLANT
PADDING CAST ABS 4INX4YD NS (CAST SUPPLIES)
PADDING CAST ABS COTTON 4X4 ST (CAST SUPPLIES) IMPLANT
PENCIL BUTTON HOLSTER BLD 10FT (ELECTRODE) ×1 IMPLANT
SLEEVE SCD COMPRESS KNEE MED (MISCELLANEOUS) ×4 IMPLANT
SLING ARM FOAM STRAP XLG (SOFTGOODS) ×3 IMPLANT
SPLINT PLASTER CAST XFAST 3X15 (CAST SUPPLIES) IMPLANT
SPLINT PLASTER CAST XFAST 4X15 (CAST SUPPLIES) IMPLANT
SPLINT PLASTER XTRA FAST SET 4 (CAST SUPPLIES)
SPLINT PLASTER XTRA FASTSET 3X (CAST SUPPLIES)
STOCKINETTE 6  STRL (DRAPES) ×2
STOCKINETTE 6 STRL (DRAPES) ×2 IMPLANT
SUCTION FRAZIER HANDLE 10FR (MISCELLANEOUS) ×2
SUCTION TUBE FRAZIER 10FR DISP (MISCELLANEOUS) ×2 IMPLANT
SUT STEEL 2 (SUTURE) IMPLANT
SUT STEEL 4 (SUTURE) IMPLANT
SUT STEEL 5 (SUTURE) IMPLANT
SUT VIC AB 2-0 PS2 27 (SUTURE) ×4 IMPLANT
SUT VIC AB 3-0 SH 27 (SUTURE)
SUT VIC AB 3-0 SH 27X BRD (SUTURE) ×1 IMPLANT
SUT VICRYL RAPIDE 4/0 PS 2 (SUTURE) ×4 IMPLANT
SYR 10ML LL (SYRINGE) IMPLANT
SYR BULB 3OZ (MISCELLANEOUS) IMPLANT
TOWEL GREEN STERILE FF (TOWEL DISPOSABLE) ×4 IMPLANT
TOWEL OR NON WOVEN STRL DISP B (DISPOSABLE) IMPLANT
TUBE CONNECTING 20'X1/4 (TUBING) ×1
TUBE CONNECTING 20X1/4 (TUBING) ×3 IMPLANT
UNDERPAD 30X30 (UNDERPADS AND DIAPERS) ×4 IMPLANT

## 2018-04-03 NOTE — Anesthesia Procedure Notes (Signed)
Procedure Name: MAC Date/Time: 04/03/2018 7:52 AM Performed by: Signe Colt, CRNA Pre-anesthesia Checklist: Patient identified, Emergency Drugs available, Suction available, Patient being monitored and Timeout performed Patient Re-evaluated:Patient Re-evaluated prior to induction Oxygen Delivery Method: Simple face mask

## 2018-04-03 NOTE — Interval H&P Note (Signed)
History and Physical Interval Note:  04/03/2018 7:35 AM  Jerome Waller  has presented today for surgery, with the diagnosis of RIGHT WRIST PAINFUL HARDWARE  The various methods of treatment have been discussed with the patient and family. After consideration of risks, benefits and other options for treatment, the patient has consented to  Procedure(s): REMOVAL HARDWARE RIGHT WRIST, POSSIBLE REVISION ARTHRODESIS (Right) as a surgical intervention .  The patient's history has been reviewed, patient examined, no change in status, stable for surgery.  I have reviewed the patient's chart and labs.  Questions were answered to the patient's satisfaction.     Jolyn Nap

## 2018-04-03 NOTE — Transfer of Care (Signed)
Immediate Anesthesia Transfer of Care Note  Patient: Jerome Waller  Procedure(s) Performed: REMOVAL HARDWARE RIGHT WRIST (Right Wrist) PARTIAL REMAOVAL OF RADIUS AND DEBRIDEMENT OF LIGAMENTS (Right Wrist)  Patient Location: PACU  Anesthesia Type:MAC combined with regional for post-op pain  Level of Consciousness: awake, alert , oriented and patient cooperative  Airway & Oxygen Therapy: Patient Spontanous Breathing and Patient connected to face mask oxygen  Post-op Assessment: Report given to RN and Post -op Vital signs reviewed and stable  Post vital signs: Reviewed and stable  Last Vitals:  Vitals Value Taken Time  BP    Temp    Pulse 90 04/03/2018  8:38 AM  Resp    SpO2 98 % 04/03/2018  8:38 AM    Last Pain:  Vitals:   04/03/18 0638  TempSrc: Oral  PainSc: 0-No pain         Complications: No apparent anesthesia complications

## 2018-04-03 NOTE — Anesthesia Procedure Notes (Signed)
Anesthesia Regional Block: Supraclavicular block   Pre-Anesthetic Checklist: ,, timeout performed, Correct Patient, Correct Site, Correct Laterality, Correct Procedure,, site marked, risks and benefits discussed, Surgical consent,  Pre-op evaluation,  At surgeon's request and post-op pain management  Laterality: Right  Prep: chloraprep       Needles:  Injection technique: Single-shot  Needle Type: Echogenic Stimulator Needle     Needle Length: 5cm  Needle Gauge: 22     Additional Needles:   Procedures:, nerve stimulator,,,, intact distal pulses,,,   Nerve Stimulator or Paresthesia:  Response: bicep contraction, 0.48 mA,   Additional Responses:   Narrative:  Start time: 04/03/2018 7:01 AM End time: 04/03/2018 7:11 AM Injection made incrementally with aspirations every 5 mL.  Performed by: Personally   Additional Notes: Functioning IV was confirmed and monitors applied.  A 33mm 22ga echogenic arrow stimulator was used. Sterile prep and drape,hand hygiene and sterile gloves were used.Ultrasound guidance: relevent anatomy identified, needle position confirmed, local anesthetic spread visualized around nerve(s)., vascular puncture avoided.  Image printed for medical record.  Negative aspiration and negative test dose prior to incremental administration of local anesthetic. The patient tolerated the procedure well.

## 2018-04-03 NOTE — Anesthesia Preprocedure Evaluation (Addendum)
Anesthesia Evaluation  Patient identified by MRN, date of birth, ID band Patient awake    Reviewed: Allergy & Precautions, NPO status , Patient's Chart, lab work & pertinent test results  History of Anesthesia Complications Negative for: history of anesthetic complications  Airway Mallampati: II  TM Distance: >3 FB Neck ROM: Full    Dental no notable dental hx. (+) Dental Advisory Given   Pulmonary Current Smoker,    Pulmonary exam normal        Cardiovascular hypertension, Pt. on medications Normal cardiovascular exam     Neuro/Psych negative neurological ROS     GI/Hepatic negative GI ROS, Neg liver ROS,   Endo/Other  diabetes  Renal/GU negative Renal ROS     Musculoskeletal negative musculoskeletal ROS (+)   Abdominal   Peds  Hematology negative hematology ROS (+)   Anesthesia Other Findings Day of surgery medications reviewed with the patient.  Reproductive/Obstetrics                            Anesthesia Physical Anesthesia Plan  ASA: II  Anesthesia Plan: MAC and Regional   Post-op Pain Management:    Induction:   PONV Risk Score and Plan: Ondansetron and Propofol infusion  Airway Management Planned: Natural Airway and Simple Face Mask  Additional Equipment:   Intra-op Plan:   Post-operative Plan:   Informed Consent: I have reviewed the patients History and Physical, chart, labs and discussed the procedure including the risks, benefits and alternatives for the proposed anesthesia with the patient or authorized representative who has indicated his/her understanding and acceptance.   Dental advisory given  Plan Discussed with: CRNA and Anesthesiologist  Anesthesia Plan Comments:        Anesthesia Quick Evaluation

## 2018-04-03 NOTE — Op Note (Signed)
04/03/2018  7:35 AM  PATIENT:  Jerome Waller  52 y.o. male  PRE-OPERATIVE DIAGNOSIS:  Painful hwr right wrist  POST-OPERATIVE DIAGNOSIS:  Same  PROCEDURE:  Removal of hwr, right wrist 20680  SURGEON: Rayvon Char. Grandville Silos, MD  PHYSICIAN ASSISTANT: Morley Kos, OPA-C  ANESTHESIA:  regional and general  SPECIMENS:  Staples removed  DRAINS:   None  EBL:  less than 50 mL  PREOPERATIVE INDICATIONS:  Jerome Waller is a  52 y.o. male with painful impinging hardware s/p limited wrist arthrodesis.  The risks benefits and alternatives were discussed with the patient preoperatively including but not limited to the risks of infection, bleeding, nerve injury, cardiopulmonary complications, the need for revision surgery, among others, and the patient verbalized understanding and consented to proceed.  OPERATIVE IMPLANTS: staples x 2 removed  OPERATIVE PROCEDURE:  After receiving prophylactic antibiotics and a regional block, the patient was escorted to the operative theatre and placed in a supine position.  A surgical "time-out" was performed during which the planned procedure, proposed operative site, and the correct patient identity were compared to the operative consent and agreement confirmed by the circulating nurse according to current facility policy.  Following application of a tourniquet to the operative extremity, the exposed skin was prepped with Chloraprep and draped in the usual sterile fashion.  The limb was exsanguinated with an Esmarch bandage and the tourniquet inflated to approximately 181mmHg higher than systolic BP.  The 3 limb zigzag dorsal wrist incision was again exploited, elevating full-thickness flaps.  The third compartment was opened, but EPL left in place and the fourth compartment elevated off of the dorsal aspect of the distal radius and the capsule.  A T-shaped capsulotomy was made, exposing the 2 staples which were removed.  The staple across the  capitate-lunate was more prominent proximally, and some of the dorsal rim of the lunate had resorbed about it.  The fusion mass appeared solid, low that the lunate had slipped into some extension prior to becoming fused.  Once the hardware was removed, it was evident that some of the capsule on the dorsal rim of the radius and become ossified, and this was removed with an osteotome, re-creating the more native shape of the rim of the radius.  The wrist was seen to flex and extend to the extent possible, without any dorsal impaction with extension.  Final images were obtained.  The wound was irrigated and the capsule closed with 2-0 Vicryl of the sutures.  The tendons were returned to their native position and the third compartment closed loosely with 2-0 Vicryl suture.  Tourniquet was released, Simbiso hemostasis unnecessary and the skin was closed with 4-0 Vicryl Rapide running horizontal mattress suture.  He was placed into a bulky dressing with a plaster and taken to recovery room in stable condition.  DISPOSITION: He will be discharged home today with instructions, return in 10 to 15 days.

## 2018-04-03 NOTE — Progress Notes (Signed)
Assisted Dr. Singer with right, ultrasound guided, interscalene  block. Side rails up, monitors on throughout procedure. See vital signs in flow sheet. Tolerated Procedure well. 

## 2018-04-03 NOTE — Anesthesia Postprocedure Evaluation (Signed)
Anesthesia Post Note  Patient: Jerome Waller  Procedure(s) Performed: REMOVAL HARDWARE RIGHT WRIST (Right Wrist) PARTIAL REMAOVAL OF RADIUS AND DEBRIDEMENT OF LIGAMENTS (Right Wrist)     Patient location during evaluation: PACU Anesthesia Type: Regional and MAC Level of consciousness: awake and alert Pain management: pain level controlled Vital Signs Assessment: post-procedure vital signs reviewed and stable Respiratory status: spontaneous breathing and respiratory function stable Cardiovascular status: stable Postop Assessment: no apparent nausea or vomiting Anesthetic complications: no    Last Vitals:  Vitals:   04/03/18 0845 04/03/18 0854  BP: 115/61   Pulse: 87 90  Resp: 18 17  Temp:    SpO2: 95% 96%    Last Pain:  Vitals:   04/03/18 0854  TempSrc:   PainSc: 0-No pain                 Eva Griffo DANIEL

## 2018-04-03 NOTE — Discharge Instructions (Signed)
Discharge Instructions   You have a soft dressing with no splint. Move your fingers as much as possible, making a full fist and fully opening the fist. Elevate your hand to reduce pain & swelling of the digits.  Ice over the operative site may be helpful to reduce pain & swelling.  DO NOT USE HEAT. Pain medicine has been prescribed for you.  Take 650 mg of Tylenol and Ibuprofen 600 mg every 6 hours together. (over the counter). Take the oxycodone additionally for severe post operative pain as a rescue medicine. Leave the dressing in place for 72 hours after surgery and then you may remove it.  You may shower, but keep the bandage clean & dry until the bandage is removed and then you may wash normally.  You may drive a car when you are off of prescription pain medications and can safely control your vehicle with both hands. Call our office to schedule an appointment for 10-15 days from the date of surgery.   Please call [phone] during normal business hours or [phone] after hours for any problems. Including the following:  - excessive redness of the incisions - drainage for more than 4 days - fever of more than 101.5 F  *Please note that pain medications will not be refilled after hours or on weekends.    Post Anesthesia Home Care Instructions  Activity: Get plenty of rest for the remainder of the day. A responsible individual must stay with you for 24 hours following the procedure.  For the next 24 hours, DO NOT: -Drive a car -Paediatric nurse -Drink alcoholic beverages -Take any medication unless instructed by your physician -Make any legal decisions or sign important papers.  Meals: Start with liquid foods such as gelatin or soup. Progress to regular foods as tolerated. Avoid greasy, spicy, heavy foods. If nausea and/or vomiting occur, drink only clear liquids until the nausea and/or vomiting subsides. Call your physician if vomiting continues.  Special  Instructions/Symptoms: Your throat may feel dry or sore from the anesthesia or the breathing tube placed in your throat during surgery. If this causes discomfort, gargle with warm salt water. The discomfort should disappear within 24 hours.  If you had a scopolamine patch placed behind your ear for the management of post- operative nausea and/or vomiting:  1. The medication in the patch is effective for 72 hours, after which it should be removed.  Wrap patch in a tissue and discard in the trash. Wash hands thoroughly with soap and water. 2. You may remove the patch earlier than 72 hours if you experience unpleasant side effects which may include dry mouth, dizziness or visual disturbances. 3. Avoid touching the patch. Wash your hands with soap and water after contact with the patch.    Regional Anesthesia Blocks  1. Numbness or the inability to move the "blocked" extremity may last from 3-48 hours after placement. The length of time depends on the medication injected and your individual response to the medication. If the numbness is not going away after 48 hours, call your surgeon.  2. The extremity that is blocked will need to be protected until the numbness is gone and the  Strength has returned. Because you cannot feel it, you will need to take extra care to avoid injury. Because it may be weak, you may have difficulty moving it or using it. You may not know what position it is in without looking at it while the block is in effect.  3. For blocks  in the legs and feet, returning to weight bearing and walking needs to be done carefully. You will need to wait until the numbness is entirely gone and the strength has returned. You should be able to move your leg and foot normally before you try and bear weight or walk. You will need someone to be with you when you first try to ensure you do not fall and possibly risk injury.  4. Bruising and tenderness at the needle site are common side effects and  will resolve in a few days.  5. Persistent numbness or new problems with movement should be communicated to the surgeon or the [place] [phone] [place] [phone]).

## 2018-04-04 ENCOUNTER — Encounter (HOSPITAL_BASED_OUTPATIENT_CLINIC_OR_DEPARTMENT_OTHER): Payer: Self-pay | Admitting: Orthopedic Surgery

## 2019-08-21 ENCOUNTER — Other Ambulatory Visit: Payer: Self-pay

## 2019-08-21 ENCOUNTER — Ambulatory Visit: Payer: Self-pay

## 2019-08-21 ENCOUNTER — Other Ambulatory Visit: Payer: Self-pay | Admitting: Emergency Medicine

## 2019-08-21 DIAGNOSIS — Z0000 Encounter for general adult medical examination without abnormal findings: Secondary | ICD-10-CM

## 2020-02-20 ENCOUNTER — Ambulatory Visit: Payer: Self-pay

## 2020-02-20 ENCOUNTER — Other Ambulatory Visit: Payer: Self-pay | Admitting: Family Medicine

## 2020-02-20 ENCOUNTER — Other Ambulatory Visit: Payer: Self-pay

## 2020-02-20 DIAGNOSIS — M25512 Pain in left shoulder: Secondary | ICD-10-CM

## 2021-06-03 IMAGING — DX DG CHEST 1V
1 series · 1 of 1 positions shown · non-contrast
Comparison: 07/14/2007

CLINICAL DATA: Physical exam.  Current smoker.

EXAM:
CHEST  1 VIEW

[chest pa]
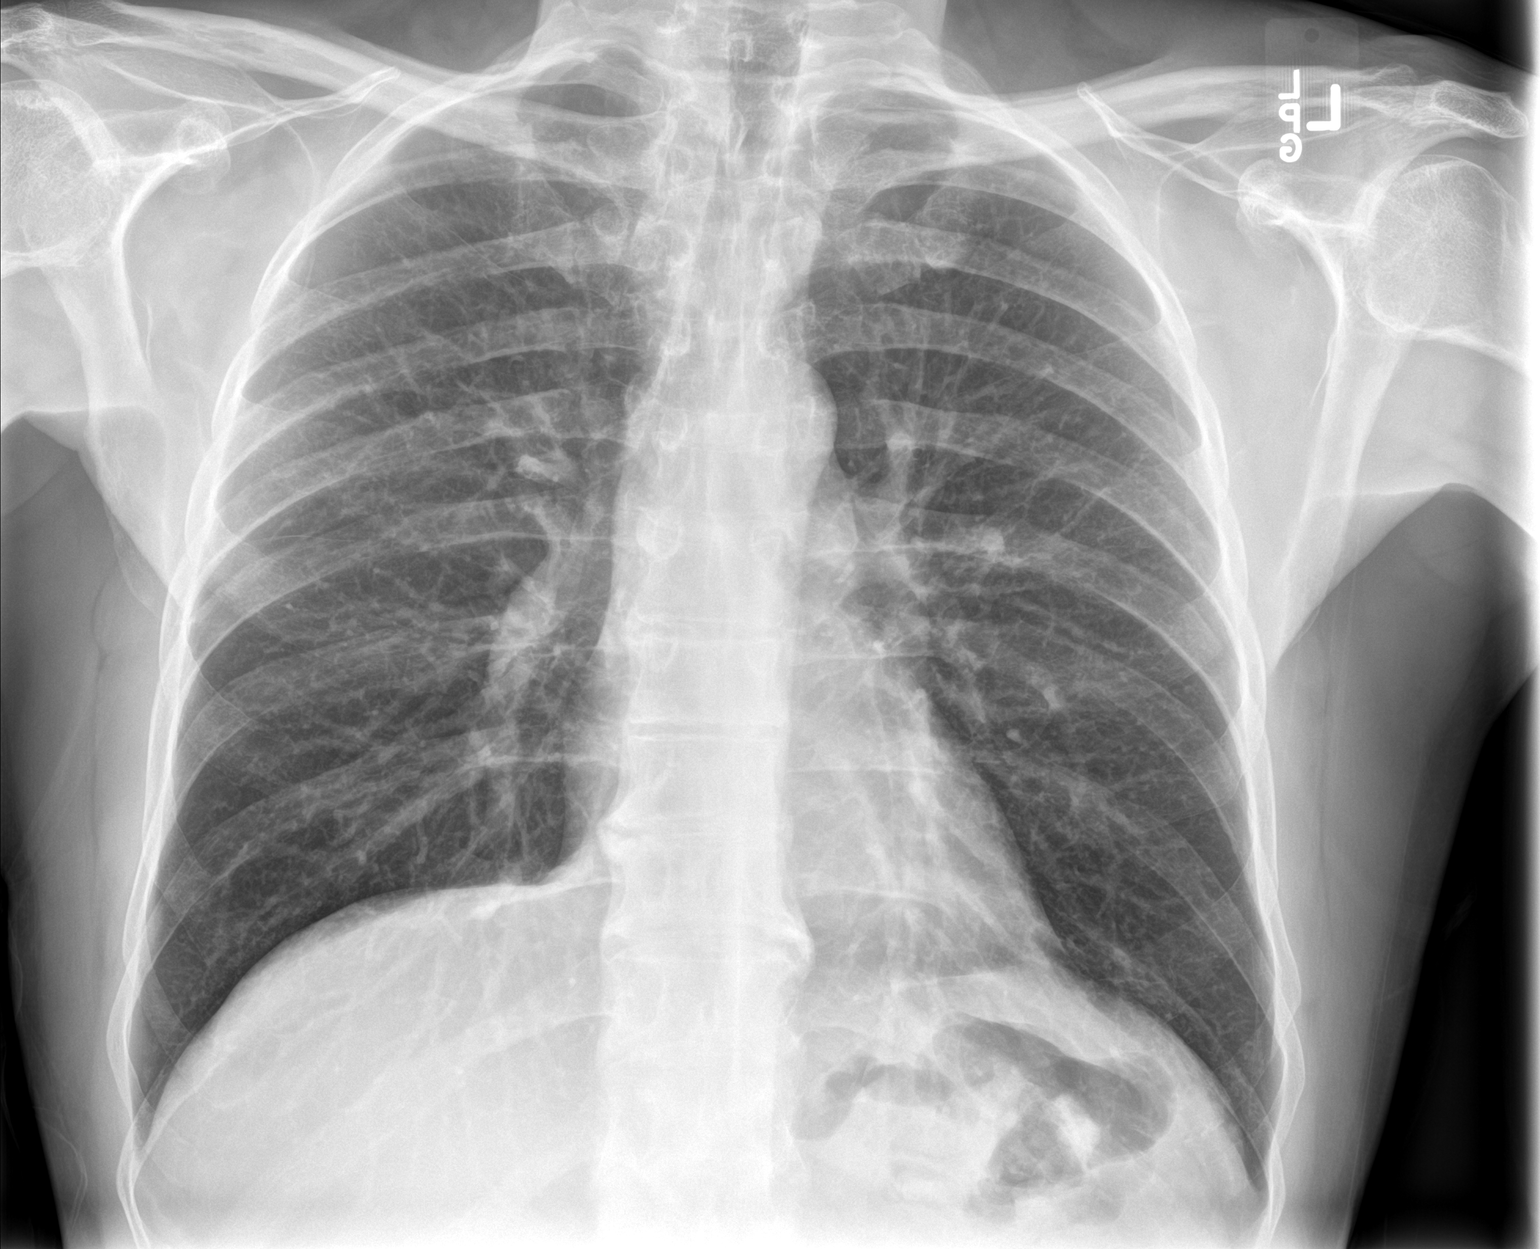

[1 of 1 positions shown; findings below may reference images not displayed]

FINDINGS: The heart size and mediastinal contours are within normal limits.
Both lungs are clear. The visualized skeletal structures are
unremarkable.
IMPRESSION: No active disease.

## 2021-12-03 IMAGING — DX DG SHOULDER 2+V*L*
3 series · 3 of 3 positions shown · non-contrast
Comparison: None.

CLINICAL DATA: Persistent left shoulder pain, lifting injury

EXAM:
LEFT SHOULDER - 2+ VIEW

[shoulder ap]
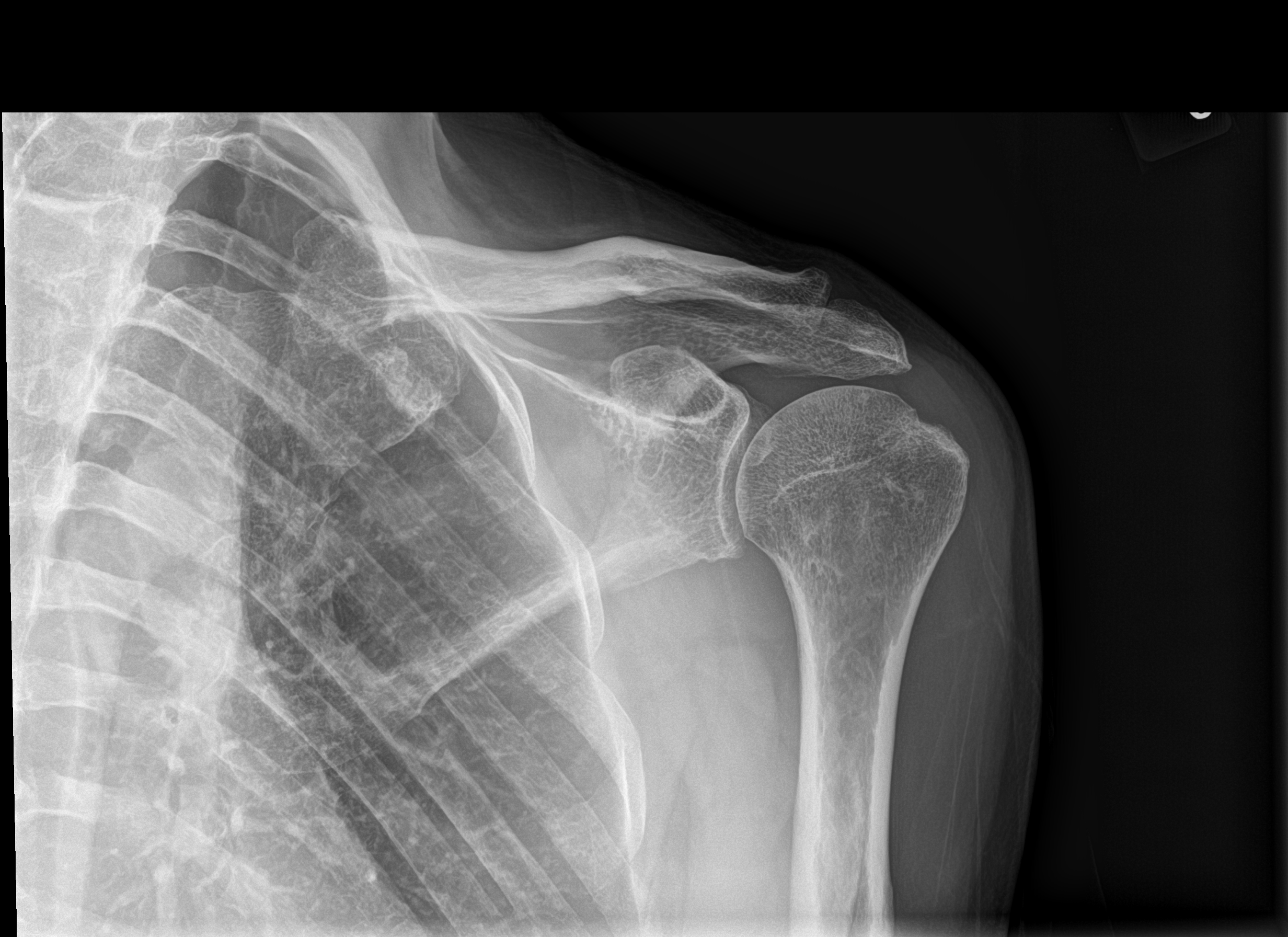

[shoulder y-view]
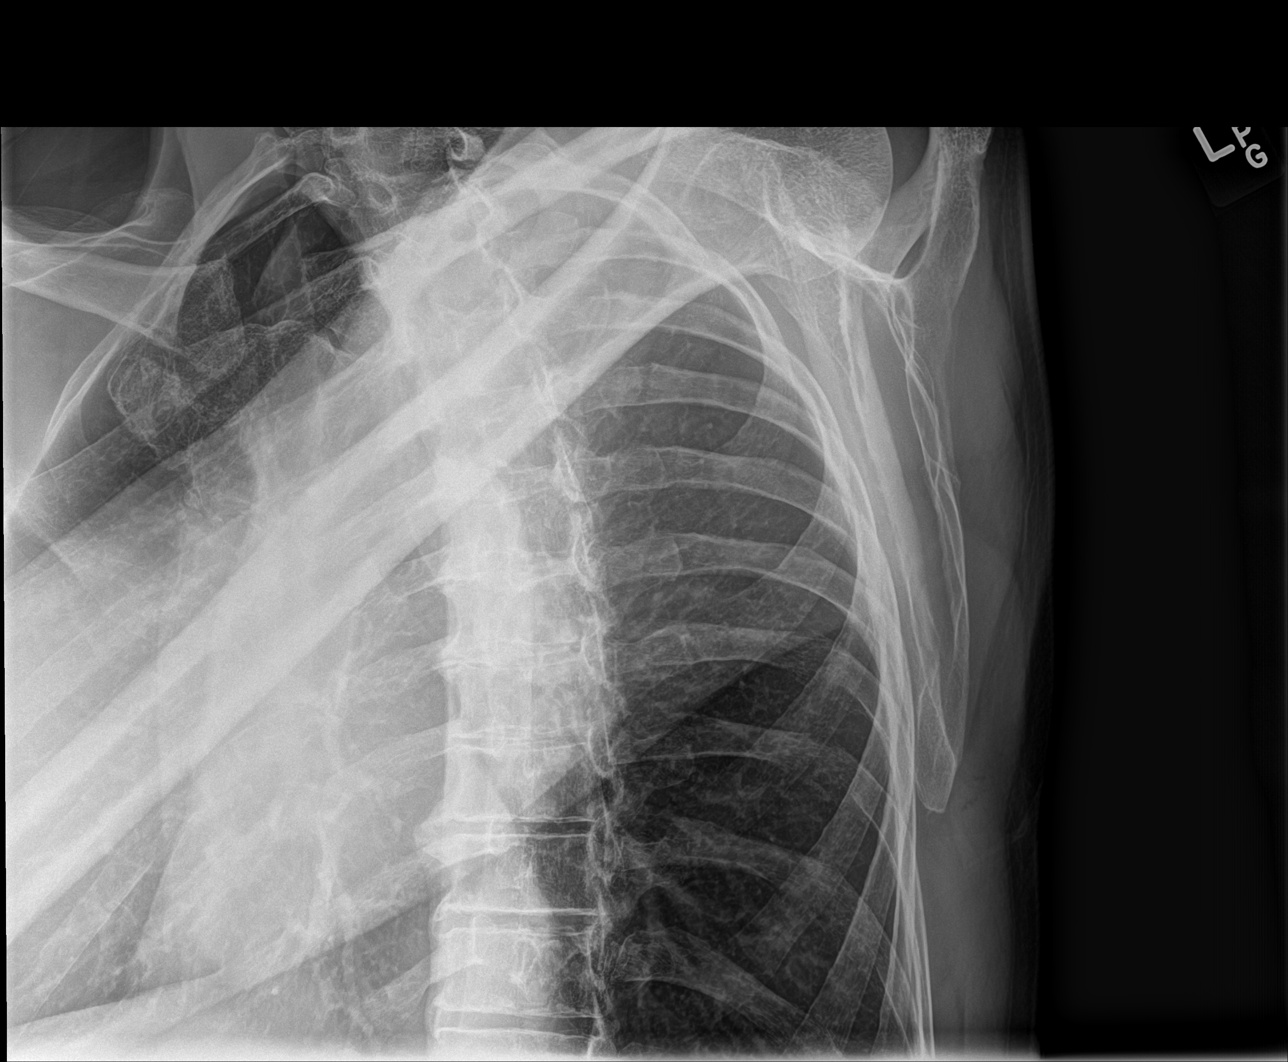

[shoulder axial]
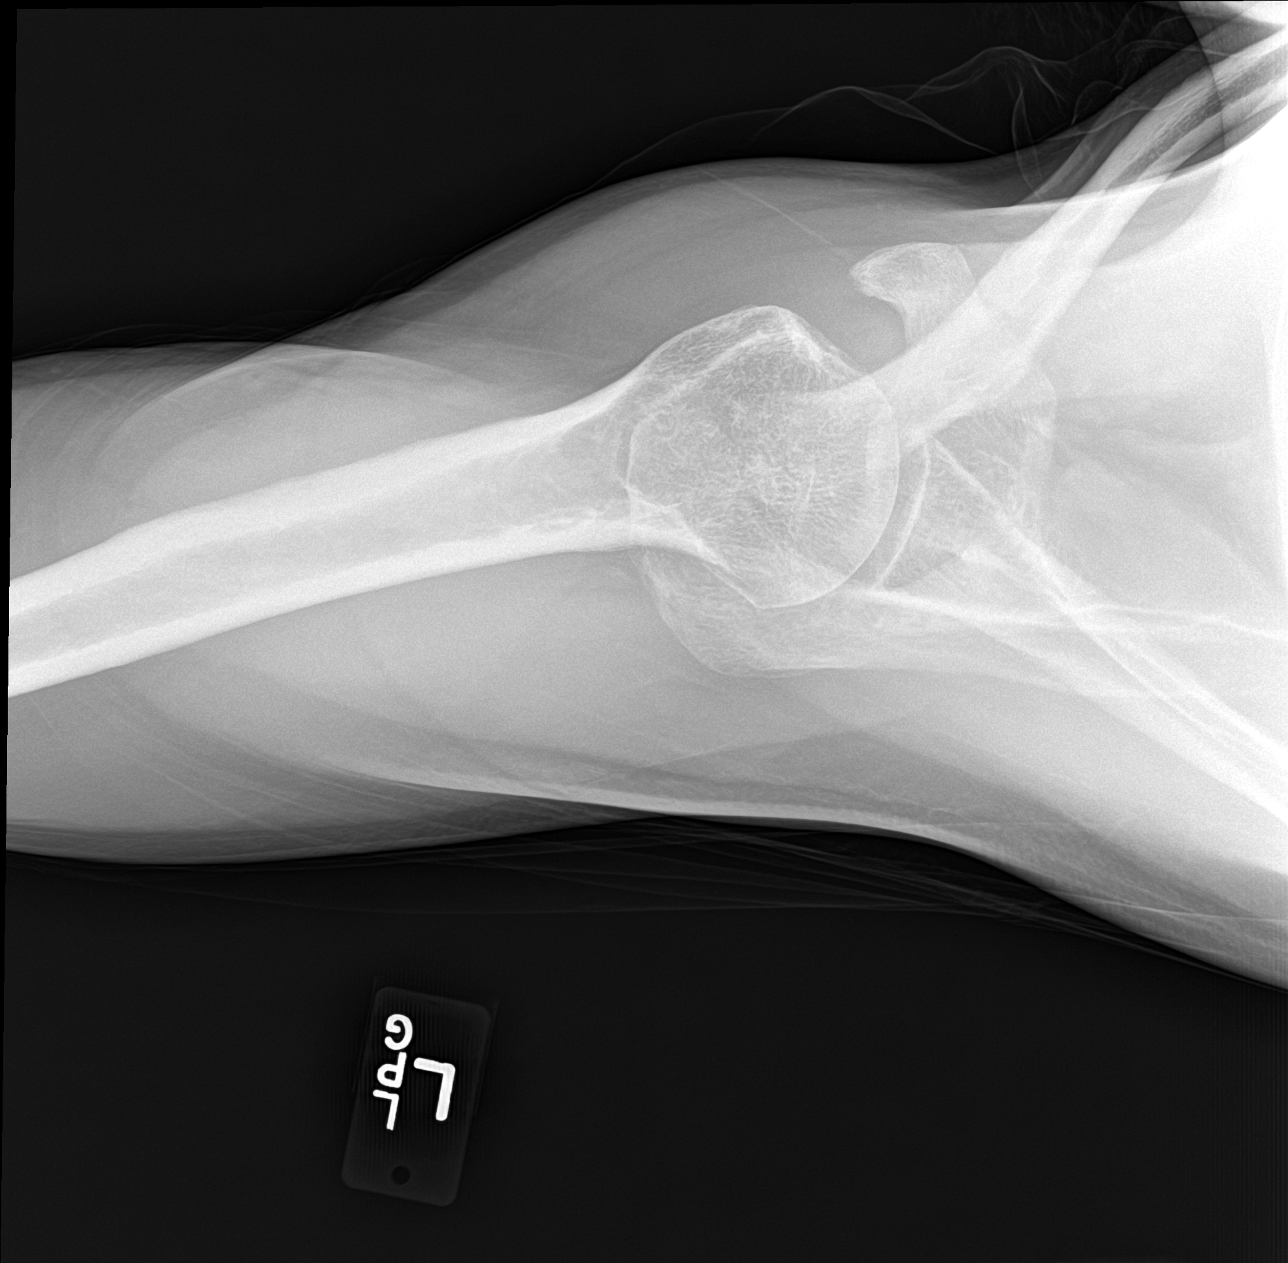

[3 of 3 positions shown; findings below may reference images not displayed]

FINDINGS: No fracture or dislocation is seen.

The joint spaces are preserved.

Visualized soft tissues are within normal limits.

Visualized left lung is clear.
IMPRESSION: Negative.

## 2021-12-10 ENCOUNTER — Other Ambulatory Visit: Payer: Self-pay | Admitting: Family Medicine

## 2021-12-10 DIAGNOSIS — F17210 Nicotine dependence, cigarettes, uncomplicated: Secondary | ICD-10-CM

## 2021-12-10 DIAGNOSIS — Z720 Tobacco use: Secondary | ICD-10-CM

## 2022-12-09 ENCOUNTER — Other Ambulatory Visit: Payer: Self-pay

## 2022-12-09 DIAGNOSIS — F17210 Nicotine dependence, cigarettes, uncomplicated: Secondary | ICD-10-CM

## 2023-11-17 ENCOUNTER — Other Ambulatory Visit: Payer: Self-pay | Admitting: Nurse Practitioner

## 2023-11-17 DIAGNOSIS — F17200 Nicotine dependence, unspecified, uncomplicated: Secondary | ICD-10-CM
